# Patient Record
Sex: Male | Born: 1960 | Hispanic: Yes | Marital: Married | Smoking: Never smoker
Health system: Southern US, Community
[De-identification: ages and names within clinical notes are randomized; demographics above are authoritative.]

## PROBLEM LIST (undated history)

## (undated) DIAGNOSIS — E119 Type 2 diabetes mellitus without complications: Secondary | ICD-10-CM

## (undated) DIAGNOSIS — I10 Essential (primary) hypertension: Secondary | ICD-10-CM

## (undated) HISTORY — PX: CHOLECYSTECTOMY: SHX55

---

## 2020-03-24 DIAGNOSIS — Z23 Encounter for immunization: Secondary | ICD-10-CM | POA: Diagnosis not present

## 2020-11-23 DIAGNOSIS — Z6834 Body mass index (BMI) 34.0-34.9, adult: Secondary | ICD-10-CM | POA: Diagnosis not present

## 2020-11-23 DIAGNOSIS — E1165 Type 2 diabetes mellitus with hyperglycemia: Secondary | ICD-10-CM | POA: Diagnosis not present

## 2020-11-23 DIAGNOSIS — Z0001 Encounter for general adult medical examination with abnormal findings: Secondary | ICD-10-CM | POA: Diagnosis not present

## 2020-11-23 DIAGNOSIS — I10 Essential (primary) hypertension: Secondary | ICD-10-CM | POA: Diagnosis not present

## 2020-11-23 DIAGNOSIS — E7800 Pure hypercholesterolemia, unspecified: Secondary | ICD-10-CM | POA: Diagnosis not present

## 2020-11-23 DIAGNOSIS — E669 Obesity, unspecified: Secondary | ICD-10-CM | POA: Diagnosis not present

## 2020-11-23 DIAGNOSIS — Z794 Long term (current) use of insulin: Secondary | ICD-10-CM | POA: Diagnosis not present

## 2020-11-23 DIAGNOSIS — M545 Low back pain, unspecified: Secondary | ICD-10-CM | POA: Diagnosis not present

## 2020-12-12 DIAGNOSIS — Z20822 Contact with and (suspected) exposure to covid-19: Secondary | ICD-10-CM | POA: Diagnosis not present

## 2020-12-12 DIAGNOSIS — Z01812 Encounter for preprocedural laboratory examination: Secondary | ICD-10-CM | POA: Diagnosis not present

## 2020-12-19 DIAGNOSIS — Z20822 Contact with and (suspected) exposure to covid-19: Secondary | ICD-10-CM | POA: Diagnosis not present

## 2021-01-18 DIAGNOSIS — R0982 Postnasal drip: Secondary | ICD-10-CM | POA: Diagnosis not present

## 2021-01-18 DIAGNOSIS — R198 Other specified symptoms and signs involving the digestive system and abdomen: Secondary | ICD-10-CM | POA: Diagnosis not present

## 2021-01-18 DIAGNOSIS — R6889 Other general symptoms and signs: Secondary | ICD-10-CM | POA: Diagnosis not present

## 2021-01-18 DIAGNOSIS — H903 Sensorineural hearing loss, bilateral: Secondary | ICD-10-CM | POA: Diagnosis not present

## 2021-03-12 ENCOUNTER — Encounter: Payer: Self-pay | Admitting: Internal Medicine

## 2021-03-12 ENCOUNTER — Other Ambulatory Visit: Payer: Self-pay

## 2021-03-12 ENCOUNTER — Ambulatory Visit (INDEPENDENT_AMBULATORY_CARE_PROVIDER_SITE_OTHER): Payer: Medicaid Other | Admitting: Internal Medicine

## 2021-03-12 VITALS — BP 134/84 | HR 84 | Temp 98.2°F | Ht 67.21 in | Wt 235.8 lb

## 2021-03-12 DIAGNOSIS — I10 Essential (primary) hypertension: Secondary | ICD-10-CM

## 2021-03-12 DIAGNOSIS — E785 Hyperlipidemia, unspecified: Secondary | ICD-10-CM | POA: Diagnosis not present

## 2021-03-12 DIAGNOSIS — E119 Type 2 diabetes mellitus without complications: Secondary | ICD-10-CM | POA: Diagnosis not present

## 2021-03-12 DIAGNOSIS — Z125 Encounter for screening for malignant neoplasm of prostate: Secondary | ICD-10-CM

## 2021-03-12 DIAGNOSIS — E1169 Type 2 diabetes mellitus with other specified complication: Secondary | ICD-10-CM

## 2021-03-12 DIAGNOSIS — Z1211 Encounter for screening for malignant neoplasm of colon: Secondary | ICD-10-CM

## 2021-03-12 DIAGNOSIS — Z1329 Encounter for screening for other suspected endocrine disorder: Secondary | ICD-10-CM | POA: Diagnosis not present

## 2021-03-12 LAB — BAYER DCA HB A1C WAIVED: HB A1C (BAYER DCA - WAIVED): 7 % — ABNORMAL HIGH (ref <7.0)

## 2021-03-12 MED ORDER — TRESIBA FLEXTOUCH 100 UNIT/ML ~~LOC~~ SOPN: 40.0000 [IU] | PEN_INJECTOR | Freq: Every day | SUBCUTANEOUS | 4 refills | Status: DC

## 2021-03-12 MED ORDER — LIDOCAINE 5 % EX PTCH: 1.0000 | MEDICATED_PATCH | CUTANEOUS | 1 refills | Status: DC

## 2021-03-12 MED ORDER — TRULICITY 1.5 MG/0.5ML ~~LOC~~ SOAJ: 1.5000 mg | SUBCUTANEOUS | 6 refills | Status: DC

## 2021-03-12 NOTE — Progress Notes (Signed)
BP 134/84   Pulse 84   Temp 98.2 F (36.8 C) (Oral)   Ht 5' 7.21" (1.707 m)   Wt 235 lb 12.8 oz (107 kg)   SpO2 95%   BMI 36.71 kg/m    Subjective:    Patient ID: Devin Arias, male    DOB: 1961/01/24, 60 y.o.   MRN: [id number]  HPI: Luiscarlos Kaczmarczyk is a 60 y.o. male  Pt moved from [place] to [place] and was looking for a pcp  Has a ho DM is on trulicity 4.00 once a week, tresiba 46 untis and metformin 1000 mg bid ,  Was seeing a pcp - [facility]   Back Pain Pertinent negatives include no headaches, weakness or weight loss.  Diabetes He presents for his follow-up (fsbs 180 - 160 checked 2 weeks ago) diabetic visit. He has type 2 diabetes mellitus. Pertinent negatives for hypoglycemia include no headaches. Pertinent negatives for diabetes include no blurred vision, no fatigue, no foot ulcerations, no polydipsia, no polyphagia, no visual change, no weakness and no weight loss. (Has diarreha in the evening )  Hypertension This is a chronic (losartan hctz) problem. Episode onset: ? if on plain losartan or losartan / hctz  The problem is controlled. Pertinent negatives include no anxiety, blurred vision, headaches, malaise/fatigue, neck pain, palpitations, peripheral edema, PND or shortness of breath.    Chief Complaint  Patient presents with  . New Patient (Initial Visit)    Patient states he has DM and HTN. Just moved to area.  . Back Pain    Patient has back pain only at night when he lays in bed.    Relevant past medical, surgical, family and social history reviewed and updated as indicated. Interim medical history since our last visit reviewed. Allergies and medications reviewed and updated.  Review of Systems  Constitutional: Negative for fatigue, malaise/fatigue and weight loss.  Eyes: Negative for blurred vision.  Respiratory: Negative for shortness of breath.   Cardiovascular: Negative for palpitations and PND.  Endocrine: Negative for polydipsia and  polyphagia.  Musculoskeletal: Positive for back pain. Negative for neck pain.  Neurological: Negative for weakness and headaches.    Per HPI unless specifically indicated above     Objective:    BP 134/84   Pulse 84   Temp 98.2 F (36.8 C) (Oral)   Ht 5' 7.21" (1.707 m)   Wt 235 lb 12.8 oz (107 kg)   SpO2 95%   BMI 36.71 kg/m   Wt Readings from Last 3 Encounters:  03/12/21 235 lb 12.8 oz (107 kg)    Physical Exam Vitals and nursing note reviewed.  Constitutional:      General: He is not in acute distress.    Appearance: Normal appearance. He is not ill-appearing or diaphoretic.  HENT:     Head: Normocephalic and atraumatic.     Right Ear: Tympanic membrane and external ear normal. There is no impacted cerumen.     Left Ear: External ear normal.     Nose: No congestion or rhinorrhea.     Mouth/Throat:     Pharynx: No oropharyngeal exudate or posterior oropharyngeal erythema.  Eyes:     Conjunctiva/sclera: Conjunctivae normal.     Pupils: Pupils are equal, round, and reactive to light.  Cardiovascular:     Rate and Rhythm: Normal rate and regular rhythm.     Heart sounds: No murmur heard. No friction rub. No gallop.   Pulmonary:     Effort: No respiratory  distress.     Breath sounds: No stridor. No wheezing or rhonchi.  Chest:     Chest wall: No tenderness.  Abdominal:     General: Abdomen is flat. Bowel sounds are normal.     Palpations: Abdomen is soft. There is no mass.     Tenderness: There is no abdominal tenderness.  Musculoskeletal:     Cervical back: Normal range of motion and neck supple. No rigidity or tenderness.     Left lower leg: No edema.  Skin:    General: Skin is warm and dry.  Neurological:     Mental Status: He is alert.     No results found for this or any previous visit.      Current Outpatient Medications:  .  atorvastatin (LIPITOR) 40 MG tablet, Take by mouth., Disp: , Rfl:  .  Cholecalciferol 50 MCG (2000 UT) TABS, Take by  mouth., Disp: , Rfl:  .  Dulaglutide (TRULICITY) 2.35 TI/1.4ER SOPN, Inject 0.75 mg into the skin once a week., Disp: , Rfl:  .  insulin degludec (TRESIBA FLEXTOUCH) 100 UNIT/ML FlexTouch Pen, Inject 46 Units into the skin daily., Disp: , Rfl:  .  losartan (COZAAR) 100 MG tablet, Take by mouth., Disp: , Rfl:  .  metFORMIN (GLUCOPHAGE) 1000 MG tablet, Take by mouth., Disp: , Rfl:     Assessment & Plan:  1. HTN : stable. HTN :  Continue current meds.  Medication compliance emphasised. pt advised to keep Bp logs. Pt verbalised understanding of the same. Pt to have a low salt diet . Exercise to reach a goal of at least 150 mins a week.  lifestyle modifications explained and pt understands importance of the above.    2. DM: check HbA1c,  urine  microalbumin  diabetic diet plan given to pt  adviced regarding hypoglycemia and instructions given to pt today on how to prevent and treat the same if it were to occur. pt acknowledges the plan and voices understanding of the same.  exercise plan given and encouraged.   advice diabetic yearly podiatry, ophthalmology , nutritionist , dental check q 6 months  Problem List Items Addressed This Visit   None   Visit Diagnoses    Diabetes mellitus without complication ([place])    -  Primary   Relevant Medications   atorvastatin (LIPITOR) 40 MG tablet   losartan (COZAAR) 100 MG tablet   metFORMIN (GLUCOPHAGE) 1000 MG tablet   Dulaglutide (TRULICITY) 1.54 MG/8.6PY SOPN   insulin degludec (TRESIBA FLEXTOUCH) 100 UNIT/ML FlexTouch Pen   Other Relevant Orders   Bayer DCA Hb A1c Waived (STAT)       Follow up plan: No follow-ups on file.  Health Maintenance : [facility] : Pneumonia vaccine : need records.

## 2021-03-13 ENCOUNTER — Telehealth: Payer: Self-pay | Admitting: *Deleted

## 2021-03-13 ENCOUNTER — Other Ambulatory Visit: Payer: Medicaid Other

## 2021-03-13 DIAGNOSIS — E119 Type 2 diabetes mellitus without complications: Secondary | ICD-10-CM | POA: Diagnosis not present

## 2021-03-13 DIAGNOSIS — Z125 Encounter for screening for malignant neoplasm of prostate: Secondary | ICD-10-CM | POA: Diagnosis not present

## 2021-03-13 DIAGNOSIS — E1169 Type 2 diabetes mellitus with other specified complication: Secondary | ICD-10-CM | POA: Diagnosis not present

## 2021-03-13 DIAGNOSIS — E785 Hyperlipidemia, unspecified: Secondary | ICD-10-CM | POA: Diagnosis not present

## 2021-03-13 DIAGNOSIS — Z1329 Encounter for screening for other suspected endocrine disorder: Secondary | ICD-10-CM | POA: Diagnosis not present

## 2021-03-13 DIAGNOSIS — I10 Essential (primary) hypertension: Secondary | ICD-10-CM | POA: Diagnosis not present

## 2021-03-13 LAB — MICROALBUMIN, URINE WAIVED
Creatinine, Urine Waived: 300 mg/dL (ref 10–300)
Microalb, Ur Waived: 30 mg/L — ABNORMAL HIGH (ref 0–19)
Microalb/Creat Ratio: <30 mg/g (ref <30)

## 2021-03-13 NOTE — Telephone Encounter (Signed)
Called [facility] to see why the patient was dispensed 48 unit directions and not the 40 unit directions that we sent in. Pharmacy dispensed a prescription from the patient's previous provider. Pharmacy advised that the patient should be able to just dial the pen to 40 units to take.  Called and informed patient about medication. Patient verbalized understanding.

## 2021-03-13 NOTE — Telephone Encounter (Signed)
Pt. Came in and said his insulin was sent in as 48 units and he was taking 46 units but he thought Dr. Michela Pitcher 40 units he want to know what he need to do please advise Thank You

## 2021-03-13 NOTE — Telephone Encounter (Signed)
RX is written for 40 units daily.   Dr. Neomia Dear, is that how many units you would like for the patient to be taking?

## 2021-03-14 LAB — COMPREHENSIVE METABOLIC PANEL
ALT: 32 IU/L (ref 0–44)
AST: 14 IU/L (ref 0–40)
Albumin/Globulin Ratio: 1.3 (ref 1.2–2.2)
Albumin: 4.1 g/dL (ref 3.8–4.9)
Alkaline Phosphatase: 133 IU/L — ABNORMAL HIGH (ref 44–121)
BUN/Creatinine Ratio: 14 (ref 9–20)
BUN: 12 mg/dL (ref 6–24)
Bilirubin Total: 0.3 mg/dL (ref 0.0–1.2)
CO2: 23 mmol/L (ref 20–29)
Calcium: 9.3 mg/dL (ref 8.7–10.2)
Chloride: 100 mmol/L (ref 96–106)
Creatinine, Ser: 0.88 mg/dL (ref 0.76–1.27)
Globulin, Total: 3.1 g/dL (ref 1.5–4.5)
Glucose: 224 mg/dL — ABNORMAL HIGH (ref 65–99)
Potassium: 4 mmol/L (ref 3.5–5.2)
Sodium: 139 mmol/L (ref 134–144)
Total Protein: 7.2 g/dL (ref 6.0–8.5)
eGFR: 99 mL/min/{1.73_m2} (ref >59)

## 2021-03-14 LAB — CBC WITH DIFFERENTIAL/PLATELET
Basophils Absolute: 0 10*3/uL (ref 0.0–0.2)
Basos: 1 %
EOS (ABSOLUTE): 0.3 10*3/uL (ref 0.0–0.4)
Eos: 3 %
Hematocrit: 44.7 % (ref 37.5–51.0)
Hemoglobin: 15.1 g/dL (ref 13.0–17.7)
Immature Grans (Abs): 0.1 10*3/uL (ref 0.0–0.1)
Immature Granulocytes: 1 %
Lymphocytes Absolute: 2.6 10*3/uL (ref 0.7–3.1)
Lymphs: 31 %
MCH: 29.2 pg (ref 26.6–33.0)
MCHC: 33.8 g/dL (ref 31.5–35.7)
MCV: 86 fL (ref 79–97)
Monocytes Absolute: 0.5 10*3/uL (ref 0.1–0.9)
Monocytes: 6 %
Neutrophils Absolute: 5 10*3/uL (ref 1.4–7.0)
Neutrophils: 58 %
Platelets: 240 10*3/uL (ref 150–450)
RBC: 5.18 x10E6/uL (ref 4.14–5.80)
RDW: 13 % (ref 11.6–15.4)
WBC: 8.5 10*3/uL (ref 3.4–10.8)

## 2021-03-14 LAB — LIPID PANEL
Chol/HDL Ratio: 3.5 ratio (ref 0.0–5.0)
Cholesterol, Total: 115 mg/dL (ref 100–199)
HDL: 33 mg/dL — ABNORMAL LOW (ref >39)
LDL Chol Calc (NIH): 43 mg/dL (ref 0–99)
Triglycerides: 249 mg/dL — ABNORMAL HIGH (ref 0–149)
VLDL Cholesterol Cal: 39 mg/dL (ref 5–40)

## 2021-03-14 LAB — PSA TOTAL+% FREE (SERIAL)
PSA, Free Pct: 47.5 %
PSA, Free: 0.19 ng/mL
Prostate Specific Ag, Serum: 0.4 ng/mL (ref 0.0–4.0)

## 2021-03-14 LAB — THYROID PANEL WITH TSH
Free Thyroxine Index: 2 (ref 1.2–4.9)
T3 Uptake Ratio: 26 % (ref 24–39)
T4, Total: 7.6 ug/dL (ref 4.5–12.0)
TSH: 3.57 u[IU]/mL (ref 0.450–4.500)

## 2021-03-15 ENCOUNTER — Ambulatory Visit: Payer: Self-pay | Admitting: Internal Medicine

## 2021-03-29 ENCOUNTER — Other Ambulatory Visit: Payer: Self-pay

## 2021-03-29 ENCOUNTER — Encounter: Payer: Self-pay | Admitting: Internal Medicine

## 2021-03-29 ENCOUNTER — Ambulatory Visit (INDEPENDENT_AMBULATORY_CARE_PROVIDER_SITE_OTHER): Payer: Medicaid Other | Admitting: Internal Medicine

## 2021-03-29 VITALS — BP 144/87 | HR 64 | Temp 97.0°F | Ht 67.21 in | Wt 236.0 lb

## 2021-03-29 DIAGNOSIS — E1169 Type 2 diabetes mellitus with other specified complication: Secondary | ICD-10-CM | POA: Diagnosis not present

## 2021-03-29 DIAGNOSIS — Z125 Encounter for screening for malignant neoplasm of prostate: Secondary | ICD-10-CM | POA: Diagnosis not present

## 2021-03-29 DIAGNOSIS — Z1329 Encounter for screening for other suspected endocrine disorder: Secondary | ICD-10-CM | POA: Diagnosis not present

## 2021-03-29 DIAGNOSIS — E119 Type 2 diabetes mellitus without complications: Secondary | ICD-10-CM

## 2021-03-29 DIAGNOSIS — E785 Hyperlipidemia, unspecified: Secondary | ICD-10-CM | POA: Diagnosis not present

## 2021-03-29 MED ORDER — ATORVASTATIN CALCIUM 80 MG PO TABS: 80.0000 mg | ORAL_TABLET | Freq: Every day | ORAL | 3 refills | Status: DC

## 2021-03-29 MED ORDER — AMLODIPINE BESYLATE 5 MG PO TABS: 5.0000 mg | ORAL_TABLET | Freq: Every day | ORAL | 3 refills | Status: DC

## 2021-03-29 MED ORDER — EMPAGLIFLOZIN 10 MG PO TABS: 10.0000 mg | ORAL_TABLET | Freq: Every day | ORAL | 6 refills | Status: DC

## 2021-03-29 NOTE — Progress Notes (Signed)
BP (!) 144/87   Pulse 64   Temp (!) 97 F (36.1 C) (Oral)   Ht 5' 7.21" (1.707 m)   Wt 236 lb (107 kg)   SpO2 97%   BMI 36.74 kg/m    Subjective:    Patient ID: Devin Arias, male    DOB: 1961/10/14, 60 y.o.   MRN: [id number]  HPI: Devin Arias is a 60 y.o. male  Per pts home bl sugar logs -   Am FSBS - 234 on 4/12 ,Now down to 135 - 190's  2 hours post prandial - 396 4/12 -- now 241    Diabetes He presents for his follow-up diabetic visit. He has type 2 diabetes mellitus. His disease course has been worsening. Pertinent negatives for hypoglycemia include no confusion, dizziness, headaches, hunger, mood changes, nervousness/anxiousness, pallor, seizures, sleepiness, speech difficulty, sweats or tremors. Pertinent negatives for diabetes include no blurred vision, no chest pain, no fatigue, no polydipsia, no polyphagia, no polyuria and no weakness.  Hypertension This is a chronic problem. The current episode started more than 1 year ago. The problem has been gradually improving since onset. The problem is controlled. Pertinent negatives include no anxiety, blurred vision, chest pain, headaches, malaise/fatigue, neck pain, orthopnea, palpitations, peripheral edema, PND, shortness of breath or sweats.    Chief Complaint  Patient presents with  . Diabetes  . Hypertension    Relevant past medical, surgical, family and social history reviewed and updated as indicated. Interim medical history since our last visit reviewed. Allergies and medications reviewed and updated.  Review of Systems  Constitutional: Negative for activity change, appetite change, chills, fatigue, fever and malaise/fatigue.  HENT: Negative for congestion, ear discharge, ear pain and facial swelling.   Eyes: Negative for blurred vision, pain, discharge and itching.  Respiratory: Negative for cough, chest tightness, shortness of breath and wheezing.   Cardiovascular: Negative for chest pain,  palpitations, orthopnea, leg swelling and PND.  Gastrointestinal: Negative for abdominal distention, abdominal pain, blood in stool, constipation, diarrhea, nausea and vomiting.  Endocrine: Negative for cold intolerance, heat intolerance, polydipsia, polyphagia and polyuria.  Genitourinary: Negative for difficulty urinating, dysuria, flank pain, frequency, hematuria and urgency.  Musculoskeletal: Negative for arthralgias, gait problem, joint swelling, myalgias and neck pain.  Skin: Negative for color change, pallor, rash and wound.  Neurological: Negative for dizziness, tremors, seizures, speech difficulty, weakness, light-headedness, numbness and headaches.  Hematological: Does not bruise/bleed easily.  Psychiatric/Behavioral: Negative for agitation, confusion, decreased concentration, sleep disturbance and suicidal ideas. The patient is not nervous/anxious.     Per HPI unless specifically indicated above     Objective:    BP (!) 144/87   Pulse 64   Temp (!) 97 F (36.1 C) (Oral)   Ht 5' 7.21" (1.707 m)   Wt 236 lb (107 kg)   SpO2 97%   BMI 36.74 kg/m   Wt Readings from Last 3 Encounters:  03/29/21 236 lb (107 kg)  03/12/21 235 lb 12.8 oz (107 kg)    Physical Exam Vitals and nursing note reviewed.  Constitutional:      General: He is not in acute distress.    Appearance: Normal appearance. He is not ill-appearing or diaphoretic.  HENT:     Head: Normocephalic and atraumatic.     Right Ear: Tympanic membrane and external ear normal. There is no impacted cerumen.     Left Ear: External ear normal.     Nose: No congestion or rhinorrhea.     Mouth/Throat:  Pharynx: No oropharyngeal exudate or posterior oropharyngeal erythema.  Eyes:     Conjunctiva/sclera: Conjunctivae normal.     Pupils: Pupils are equal, round, and reactive to light.  Cardiovascular:     Rate and Rhythm: Normal rate and regular rhythm.     Heart sounds: No murmur heard. No friction rub. No gallop.    Pulmonary:     Effort: No respiratory distress.     Breath sounds: No stridor. No wheezing or rhonchi.  Chest:     Chest wall: No tenderness.  Abdominal:     General: Abdomen is flat. Bowel sounds are normal. There is no distension.     Palpations: Abdomen is soft. There is no mass.     Tenderness: There is no abdominal tenderness. There is no guarding.  Musculoskeletal:        General: No swelling or deformity.     Cervical back: Normal range of motion and neck supple. No rigidity or tenderness.     Right lower leg: No edema.     Left lower leg: No edema.  Skin:    General: Skin is warm and dry.     Coloration: Skin is not jaundiced.     Findings: No erythema.  Neurological:     Mental Status: He is alert and oriented to person, place, and time. Mental status is at baseline.  Psychiatric:        Mood and Affect: Mood normal.        Behavior: Behavior normal.        Thought Content: Thought content normal.        Judgment: Judgment normal.     Results for orders placed or performed in visit on 03/12/21  Bayer DCA Hb A1c Waived (STAT)  Result Value Ref Range   HB A1C (BAYER DCA - WAIVED) 7.0 (H) <7.0 %  PSA Total+%Free (Serial)  Result Value Ref Range   Prostate Specific Ag, Serum 0.4 0.0 - 4.0 ng/mL   PSA, Free 0.19 N/A ng/mL   PSA, Free Pct 47.5 %  Comprehensive metabolic panel  Result Value Ref Range   Glucose 224 (H) 65 - 99 mg/dL   BUN 12 6 - 24 mg/dL   Creatinine, Ser 0.88 0.76 - 1.27 mg/dL   eGFR 99 >59 mL/min/1.73   BUN/Creatinine Ratio 14 9 - 20   Sodium 139 134 - 144 mmol/L   Potassium 4.0 3.5 - 5.2 mmol/L   Chloride 100 96 - 106 mmol/L   CO2 23 20 - 29 mmol/L   Calcium 9.3 8.7 - 10.2 mg/dL   Total Protein 7.2 6.0 - 8.5 g/dL   Albumin 4.1 3.8 - 4.9 g/dL   Globulin, Total 3.1 1.5 - 4.5 g/dL   Albumin/Globulin Ratio 1.3 1.2 - 2.2   Bilirubin Total 0.3 0.0 - 1.2 mg/dL   Alkaline Phosphatase 133 (H) 44 - 121 IU/L   AST 14 0 - 40 IU/L   ALT 32 0 - 44 IU/L   CBC with Differential/Platelet  Result Value Ref Range   WBC 8.5 3.4 - 10.8 x10E3/uL   RBC 5.18 4.14 - 5.80 x10E6/uL   Hemoglobin 15.1 13.0 - 17.7 g/dL   Hematocrit 44.7 37.5 - 51.0 %   MCV 86 79 - 97 fL   MCH 29.2 26.6 - 33.0 pg   MCHC 33.8 31.5 - 35.7 g/dL   RDW 13.0 11.6 - 15.4 %   Platelets 240 150 - 450 x10E3/uL   Neutrophils 58 Not Estab. %  Lymphs 31 Not Estab. %   Monocytes 6 Not Estab. %   Eos 3 Not Estab. %   Basos 1 Not Estab. %   Neutrophils Absolute 5.0 1.4 - 7.0 x10E3/uL   Lymphocytes Absolute 2.6 0.7 - 3.1 x10E3/uL   Monocytes Absolute 0.5 0.1 - 0.9 x10E3/uL   EOS (ABSOLUTE) 0.3 0.0 - 0.4 x10E3/uL   Basophils Absolute 0.0 0.0 - 0.2 x10E3/uL   Immature Granulocytes 1 Not Estab. %   Immature Grans (Abs) 0.1 0.0 - 0.1 x10E3/uL  Thyroid Panel With TSH  Result Value Ref Range   TSH 3.570 0.450 - 4.500 uIU/mL   T4, Total 7.6 4.5 - 12.0 ug/dL   T3 Uptake Ratio 26 24 - 39 %   Free Thyroxine Index 2.0 1.2 - 4.9  Lipid panel  Result Value Ref Range   Cholesterol, Total 115 100 - 199 mg/dL   Triglycerides 249 (H) 0 - 149 mg/dL   HDL 33 (L) >39 mg/dL   VLDL Cholesterol Cal 39 5 - 40 mg/dL   LDL Chol Calc (NIH) 43 0 - 99 mg/dL   Chol/HDL Ratio 3.5 0.0 - 5.0 ratio  Microalbumin, Urine Waived  Result Value Ref Range   Microalb, Ur Waived 30 (H) 0 - 19 mg/L   Creatinine, Urine Waived 300 10 - 300 mg/dL   Microalb/Creat Ratio <30 <30 mg/g        Current Outpatient Medications:  .  atorvastatin (LIPITOR) 40 MG tablet, Take by mouth., Disp: , Rfl:  .  Cholecalciferol 50 MCG (2000 UT) TABS, Take by mouth., Disp: , Rfl:  .  Dulaglutide (TRULICITY) 1.5 EX/9.3ZJ SOPN, Inject 1.5 mg into the skin once a week., Disp: 0.5 mL, Rfl: 6 .  insulin degludec (TRESIBA FLEXTOUCH) 100 UNIT/ML FlexTouch Pen, Inject 40 Units into the skin at bedtime., Disp: 100 mL, Rfl: 4 .  lidocaine (LIDODERM) 5 %, Place 1 patch onto the skin daily. Remove & Discard patch within 12 hours or as  directed by MD, Disp: 30 patch, Rfl: 1 .  losartan-hydrochlorothiazide (HYZAAR) 100-25 MG tablet, Take 1 tablet by mouth daily., Disp: , Rfl:  .  metFORMIN (GLUCOPHAGE) 1000 MG tablet, Take by mouth., Disp: , Rfl:  .  cyclobenzaprine (FLEXERIL) 10 MG tablet, Take 1 tablet by mouth 3 (three) times daily as needed., Disp: , Rfl:  .  gabapentin (NEURONTIN) 300 MG capsule, Take by mouth at bedtime as needed., Disp: , Rfl:  .  ibuprofen (ADVIL) 600 MG tablet, Take 600 mg by mouth 2 (two) times daily as needed., Disp: , Rfl:     Assessment & Plan:  1.DM :  trulicity increased to 1.5 last visit.  Reduce tresiba to 30 units . Add jardiance 10 mg to regimen as sugars have been better but not at goal.  Ref. Range 03/12/2021 11:16 03/13/2021 08:02 03/13/2021 08:07  Glucose Latest Ref Range: 65 - 99 mg/dL   224 (H)  HB A1C (BAYER DCA - WAIVED) Latest Ref Range: <7.0 % 7.0 (H)      2. HTG  Increase atorvastatin to 80 mg.  recheck FLP, check LFT's work on diet, SE of meds explained to pt. low fat and high fiber diet explained to pt.   Ref. Range 03/13/2021 08:07  Total CHOL/HDL Ratio Latest Ref Range: 0.0 - 5.0 ratio 3.5  Cholesterol, Total Latest Ref Range: 100 - 199 mg/dL 115  HDL Cholesterol Latest Ref Range: >39 mg/dL 33 (L)  Triglycerides Latest Ref Range: 0 - 149  mg/dL 249 (H)  VLDL Cholesterol Cal Latest Ref Range: 5 - 40 mg/dL 39  LDL Chol Calc (NIH) Latest Ref Range: 0 - 99 mg/dL 43    3. HTN stable,add amlodipine bp not at goal. conitnue losartan / hctz Continue current meds.  Medication compliance emphasised. pt advised to keep Bp logs. Pt verbalised understanding of the same. Pt to have a low salt diet . Exercise to reach a goal of at least 150 mins a week.  lifestyle modifications explained and pt understands importance of the above.  Problem List Items Addressed This Visit   None   Visit Diagnoses    Screening PSA (prostate specific antigen)    -  Primary   Relevant Orders   PSA  Total+%Free (Serial)   Diabetes mellitus without complication ([facility])       Relevant Medications   empagliflozin (JARDIANCE) 10 MG TABS tablet   atorvastatin (LIPITOR) 80 MG tablet   Other Relevant Orders   Comprehensive metabolic panel   CBC with Differential/Platelet   Bayer DCA Hb A1c Waived   Screening for thyroid disorder       Relevant Orders   Thyroid Panel With TSH   Hyperlipidemia associated with type 2 diabetes mellitus ([facility])       Relevant Medications   empagliflozin (JARDIANCE) 10 MG TABS tablet   atorvastatin (LIPITOR) 80 MG tablet   Other Relevant Orders   Lipid panel       Follow up plan: Return in about 6 weeks (around 05/10/2021).

## 2021-03-29 NOTE — Patient Instructions (Signed)
Hypertension, Adult Hypertension is another name for high blood pressure. High blood pressure forces your heart to work harder to pump blood. This can cause problems over time. There are two numbers in a blood pressure reading. There is a top number (systolic) over a bottom number (diastolic). It is best to have a blood pressure that is below 120/80. Healthy choices can help lower your blood pressure, or you may need medicine to help lower it. What are the causes? The cause of this condition is not known. Some conditions may be related to high blood pressure. What increases the risk?  Smoking.  Having type 2 diabetes mellitus, high cholesterol, or both.  Not getting enough exercise or physical activity.  Being overweight.  Having too much fat, sugar, calories, or salt (sodium) in your diet.  Drinking too much alcohol.  Having long-term (chronic) kidney disease.  Having a family history of high blood pressure.  Age. Risk increases with age.  Race. You may be at higher risk if you are African American.  Gender. Men are at higher risk than women before age 45. After age 65, women are at higher risk than men.  Having obstructive sleep apnea.  Stress. What are the signs or symptoms?  High blood pressure may not cause symptoms. Very high blood pressure (hypertensive crisis) may cause: ? Headache. ? Feelings of worry or nervousness (anxiety). ? Shortness of breath. ? Nosebleed. ? A feeling of being sick to your stomach (nausea). ? Throwing up (vomiting). ? Changes in how you see. ? Very bad chest pain. ? Seizures. How is this treated?  This condition is treated by making healthy lifestyle changes, such as: ? Eating healthy foods. ? Exercising more. ? Drinking less alcohol.  Your health care provider may prescribe medicine if lifestyle changes are not enough to get your blood pressure under control, and if: ? Your top number is above 130. ? Your bottom number is above  80.  Your personal target blood pressure may vary. Follow these instructions at home: Eating and drinking  If told, follow the DASH eating plan. To follow this plan: ? Fill one half of your plate at each meal with fruits and vegetables. ? Fill one fourth of your plate at each meal with whole grains. Whole grains include whole-wheat pasta, brown rice, and whole-grain bread. ? Eat or drink low-fat dairy products, such as skim milk or low-fat yogurt. ? Fill one fourth of your plate at each meal with low-fat (lean) proteins. Low-fat proteins include fish, chicken without skin, eggs, beans, and tofu. ? Avoid fatty meat, cured and processed meat, or chicken with skin. ? Avoid pre-made or processed food.  Eat less than 1,500 mg of salt each day.  Do not drink alcohol if: ? Your doctor tells you not to drink. ? You are pregnant, may be pregnant, or are planning to become pregnant.  If you drink alcohol: ? Limit how much you use to:  0-1 drink a day for women.  0-2 drinks a day for men. ? Be aware of how much alcohol is in your drink. In the [place] one drink equals one 12 oz bottle of beer (355 mL), one 5 oz glass of wine (148 mL), or one 1 oz glass of hard liquor (44 mL).   Lifestyle  Work with your doctor to stay at a healthy weight or to lose weight. Ask your doctor what the best weight is for you.  Get at least 30 minutes of exercise most   days of the week. This may include walking, swimming, or biking.  Get at least 30 minutes of exercise that strengthens your muscles (resistance exercise) at least 3 days a week. This may include lifting weights or doing Pilates.  Do not use any products that contain nicotine or tobacco, such as cigarettes, e-cigarettes, and chewing tobacco. If you need help quitting, ask your doctor.  Check your blood pressure at home as told by your doctor.  Keep all follow-up visits as told by your doctor. This is important.   Medicines  Take over-the-counter  and prescription medicines only as told by your doctor. Follow directions carefully.  Do not skip doses of blood pressure medicine. The medicine does not work as well if you skip doses. Skipping doses also puts you at risk for problems.  Ask your doctor about side effects or reactions to medicines that you should watch for. Contact a doctor if you:  Think you are having a reaction to the medicine you are taking.  Have headaches that keep coming back (recurring).  Feel dizzy.  Have swelling in your ankles.  Have trouble with your vision. Get help right away if you:  Get a very bad headache.  Start to feel mixed up (confused).  Feel weak or numb.  Feel faint.  Have very bad pain in your: ? Chest. ? Belly (abdomen).  Throw up more than once.  Have trouble breathing. Summary  Hypertension is another name for high blood pressure.  High blood pressure forces your heart to work harder to pump blood.  For most people, a normal blood pressure is less than 120/80.  Making healthy choices can help lower blood pressure. If your blood pressure does not get lower with healthy choices, you may need to take medicine. This information is not intended to replace advice given to you by your health care provider. Make sure you discuss any questions you have with your health care provider. Document Revised: 07/29/2018 Document Reviewed: 07/29/2018 Elsevier Patient Education  2021 [place]. Diabetes Mellitus Action Plan Following a diabetes action plan is a way for you to manage your diabetes (diabetes mellitus) symptoms. The plan is color-coded to help you understand what actions you need to take based on any symptoms you are having.  If you have symptoms in the red zone, you need medical care right away.  If you have symptoms in the yellow zone, you are having problems.  If you have symptoms in the green zone, you are doing well. Learning about and understanding diabetes can  take time. Follow the plan that you develop with your health care provider. Know the target range for your blood sugar (glucose) level, and review your treatment plan with your health care provider at each visit. The target range for my blood sugar level is __________________________ mg/dL. Red zone Get medical help right away if you have any of the following symptoms:  A blood sugar test result that is below 54 mg/dL (3 mmol/L).  A blood sugar test result that is at or above 240 mg/dL (13.3 mmol/L) for 2 days in a row.  Confusion or trouble thinking clearly.  Difficulty breathing.  Sickness or a fever for 2 or more days that is not getting better.  Moderate or large ketone levels in your urine.  Feeling tired or having no energy. If you have any red zone symptoms, do not wait to see if the symptoms will go away. Get medical help right away. Call your local emergency  services (911 in the [place] Do not drive yourself to the hospital. If you have severely low blood sugar (severe hypoglycemia) and you cannot eat or drink, you may need glucagon. Make sure a family member or close friend knows how to check your blood sugar and how to give you glucagon. You may need to be treated in a hospital for this condition.   Yellow zone If you have any of the following symptoms, your diabetes is not under control and you may need to make some changes:  A blood sugar test result that is at or above 240 mg/dL (13.3 mmol/L) for 2 days in a row.  Blood sugar test results that are below 70 mg/dL (3.9 mmol/L).  Other symptoms of hypoglycemia, such as: ? Shaking or feeling light-headed. ? Confusion or irritability. ? Feeling hungry. ? Having a fast heartbeat. If you have any yellow zone symptoms:  Treat your hypoglycemia by eating or drinking 15 grams of a rapid-acting carbohydrate. Follow the 15:15 rule: ? Take 15 grams of a rapid-acting carbohydrate, such as:  1 tube of glucose gel.  4 glucose  pills.  4 oz (120 mL) of fruit juice.  4 oz (120 mL) of regular (not diet) soda. ? Check your blood sugar 15 minutes after you take the carbohydrate. ? If the repeat blood sugar test is still at or below 70 mg/dL (3.9 mmol/L), take 15 grams of a carbohydrate again. ? If your blood sugar does not increase above 70 mg/dL (3.9 mmol/L) after 3 tries, get medical help right away. ? After your blood sugar returns to normal, eat a meal or a snack within 1 hour.  Keep taking your daily medicines as told by your health care provider.  Check your blood sugar more often than you normally would. ? Write down your results. ? Call your health care provider if you have trouble keeping your blood sugar in your target range.   Green zone These signs mean you are doing well and you can continue what you are doing to manage your diabetes:  Your blood sugar is within your personal target range. For most people, a blood sugar level before a meal (preprandial) should be 80-130 mg/dL (4.4-7.2 mmol/L).  You feel well, and you are able to do daily activities. If you are in the green zone, continue to manage your diabetes as told by your health care provider. To do this:  Eat a healthy diet.  Exercise regularly.  Check your blood sugar as told by your health care provider.  Take your medicines as told by your health care provider.   Where to find more information  American Diabetes Association (ADA): diabetes.org  Association of Diabetes Care & Education Specialists (ADCES): diabeteseducator.org Summary  Following a diabetes action plan is a way for you to manage your diabetes symptoms. The plan is color-coded to help you understand what actions you need to take based on any symptoms you are having.  Follow the plan that you develop with your health care provider. Make sure you know your personal target blood sugar level.  Review your treatment plan with your health care provider at each visit. This  information is not intended to replace advice given to you by your health care provider. Make sure you discuss any questions you have with your health care provider. Document Revised: 05/25/2020 Document Reviewed: 05/25/2020 Elsevier Patient Education  [place].

## 2021-05-08 ENCOUNTER — Other Ambulatory Visit: Payer: Medicaid Other

## 2021-05-08 ENCOUNTER — Other Ambulatory Visit: Payer: Self-pay

## 2021-05-08 DIAGNOSIS — E1169 Type 2 diabetes mellitus with other specified complication: Secondary | ICD-10-CM | POA: Diagnosis not present

## 2021-05-08 DIAGNOSIS — Z1329 Encounter for screening for other suspected endocrine disorder: Secondary | ICD-10-CM

## 2021-05-08 DIAGNOSIS — Z125 Encounter for screening for malignant neoplasm of prostate: Secondary | ICD-10-CM | POA: Diagnosis not present

## 2021-05-08 DIAGNOSIS — E785 Hyperlipidemia, unspecified: Secondary | ICD-10-CM | POA: Diagnosis not present

## 2021-05-08 DIAGNOSIS — E119 Type 2 diabetes mellitus without complications: Secondary | ICD-10-CM | POA: Diagnosis not present

## 2021-05-08 LAB — BAYER DCA HB A1C WAIVED: HB A1C (BAYER DCA - WAIVED): 7.2 % — ABNORMAL HIGH (ref <7.0)

## 2021-05-09 LAB — COMPREHENSIVE METABOLIC PANEL
ALT: 34 IU/L (ref 0–44)
AST: 24 IU/L (ref 0–40)
Albumin/Globulin Ratio: 1.4 (ref 1.2–2.2)
Albumin: 4.2 g/dL (ref 3.8–4.9)
Alkaline Phosphatase: 123 IU/L — ABNORMAL HIGH (ref 44–121)
BUN/Creatinine Ratio: 16 (ref 9–20)
BUN: 15 mg/dL (ref 6–24)
Bilirubin Total: 0.5 mg/dL (ref 0.0–1.2)
CO2: 24 mmol/L (ref 20–29)
Calcium: 9.2 mg/dL (ref 8.7–10.2)
Chloride: 100 mmol/L (ref 96–106)
Creatinine, Ser: 0.96 mg/dL (ref 0.76–1.27)
Globulin, Total: 3 g/dL (ref 1.5–4.5)
Glucose: 150 mg/dL — ABNORMAL HIGH (ref 65–99)
Potassium: 3.7 mmol/L (ref 3.5–5.2)
Sodium: 141 mmol/L (ref 134–144)
Total Protein: 7.2 g/dL (ref 6.0–8.5)
eGFR: 91 mL/min/{1.73_m2} (ref >59)

## 2021-05-09 LAB — CBC WITH DIFFERENTIAL/PLATELET
Basophils Absolute: 0 10*3/uL (ref 0.0–0.2)
Basos: 1 %
EOS (ABSOLUTE): 0.3 10*3/uL (ref 0.0–0.4)
Eos: 4 %
Hematocrit: 45.3 % (ref 37.5–51.0)
Hemoglobin: 15.2 g/dL (ref 13.0–17.7)
Immature Grans (Abs): 0 10*3/uL (ref 0.0–0.1)
Immature Granulocytes: 0 %
Lymphocytes Absolute: 2.3 10*3/uL (ref 0.7–3.1)
Lymphs: 29 %
MCH: 29.1 pg (ref 26.6–33.0)
MCHC: 33.6 g/dL (ref 31.5–35.7)
MCV: 87 fL (ref 79–97)
Monocytes Absolute: 0.6 10*3/uL (ref 0.1–0.9)
Monocytes: 7 %
Neutrophils Absolute: 4.6 10*3/uL (ref 1.4–7.0)
Neutrophils: 59 %
Platelets: 233 10*3/uL (ref 150–450)
RBC: 5.23 x10E6/uL (ref 4.14–5.80)
RDW: 13.1 % (ref 11.6–15.4)
WBC: 7.8 10*3/uL (ref 3.4–10.8)

## 2021-05-09 LAB — LIPID PANEL
Chol/HDL Ratio: 3 ratio (ref 0.0–5.0)
Cholesterol, Total: 108 mg/dL (ref 100–199)
HDL: 36 mg/dL — ABNORMAL LOW (ref >39)
LDL Chol Calc (NIH): 43 mg/dL (ref 0–99)
Triglycerides: 177 mg/dL — ABNORMAL HIGH (ref 0–149)
VLDL Cholesterol Cal: 29 mg/dL (ref 5–40)

## 2021-05-09 LAB — THYROID PANEL WITH TSH
Free Thyroxine Index: 2.3 (ref 1.2–4.9)
T3 Uptake Ratio: 28 % (ref 24–39)
T4, Total: 8.1 ug/dL (ref 4.5–12.0)
TSH: 2.39 u[IU]/mL (ref 0.450–4.500)

## 2021-05-09 LAB — PSA TOTAL+% FREE (SERIAL)
PSA, Free Pct: 37.5 %
PSA, Free: 0.15 ng/mL
Prostate Specific Ag, Serum: 0.4 ng/mL (ref 0.0–4.0)

## 2021-05-10 ENCOUNTER — Other Ambulatory Visit: Payer: Self-pay | Admitting: Internal Medicine

## 2021-05-10 NOTE — Telephone Encounter (Signed)
  Notes to clinic: medication filled by a historical provider  Review for refills   Requested Prescriptions  Pending Prescriptions Disp Refills   Cholecalciferol 50 MCG (2000 UT) TABS 30 tablet     Sig: Take by mouth.      Endocrinology:  Vitamins - Vitamin D Supplementation Failed - 05/10/2021  2:08 PM      Failed - 50,000 IU strengths are not delegated      Failed - Phosphate in normal range and within 360 days    No results found for: PHOS        Failed - Vitamin D in normal range and within 360 days    No results found for: [id number], [id number], [id number], Lamar, Raymond, 25OHVITD3, 25OHVITD2, 25OHVITD1, 25OHVITD2, 25OHVITD3, VD25OH        Passed - Ca in normal range and within 360 days    Calcium  Date Value Ref Range Status  05/08/2021 9.2 8.7 - 10.2 mg/dL Final          Passed - Valid encounter within last 12 months    Recent Outpatient Visits           1 month ago Screening PSA (prostate specific antigen)   [facility] Vigg, Avanti, MD   1 month ago Diabetes mellitus without complication ([place])   [place], MD       Future Appointments             In 5 days Vigg, Avanti, MD [facility]

## 2021-05-10 NOTE — Telephone Encounter (Signed)
Pt is requesting a refill of Vit D. Not on his active medication. And pt is not sure who the original prescriber is.  Pharmacy- [place], [phone]

## 2021-05-10 NOTE — Telephone Encounter (Signed)
Scheduled 6/14

## 2021-05-15 ENCOUNTER — Other Ambulatory Visit: Payer: Self-pay

## 2021-05-15 ENCOUNTER — Ambulatory Visit (INDEPENDENT_AMBULATORY_CARE_PROVIDER_SITE_OTHER): Payer: Medicaid Other | Admitting: Internal Medicine

## 2021-05-15 VITALS — BP 125/79 | HR 70 | Temp 98.6°F | Ht 66.97 in | Wt 230.4 lb

## 2021-05-15 DIAGNOSIS — E119 Type 2 diabetes mellitus without complications: Secondary | ICD-10-CM

## 2021-05-15 DIAGNOSIS — E785 Hyperlipidemia, unspecified: Secondary | ICD-10-CM | POA: Diagnosis not present

## 2021-05-15 DIAGNOSIS — E1169 Type 2 diabetes mellitus with other specified complication: Secondary | ICD-10-CM | POA: Diagnosis not present

## 2021-05-15 MED ORDER — TRESIBA FLEXTOUCH 100 UNIT/ML ~~LOC~~ SOPN: 10.0000 [IU] | PEN_INJECTOR | Freq: Every day | SUBCUTANEOUS | 4 refills | Status: DC

## 2021-05-15 MED ORDER — TRULICITY 3 MG/0.5ML ~~LOC~~ SOAJ: 3.0000 mg | SUBCUTANEOUS | 5 refills | Status: DC

## 2021-05-15 MED ORDER — EMPAGLIFLOZIN 25 MG PO TABS: 25.0000 mg | ORAL_TABLET | Freq: Every day | ORAL | 6 refills | Status: DC

## 2021-05-15 NOTE — Progress Notes (Signed)
BP 125/79   Pulse 70   Temp 98.6 F (37 C) (Oral)   Ht 5' 6.97" (1.701 m)   Wt 230 lb 6.4 oz (104.5 kg)   SpO2 99%   BMI 36.12 kg/m    Subjective:    Patient ID: Devin Arias, male    DOB: Apr 12, 1961, 60 y.o.   MRN: [id number]  Chief Complaint  Patient presents with  . Diabetes  . Hyperlipidemia    HPI: Devin Arias is a 60 y.o. male   Here Pt says he feels happy since his sugars have been much better since med changes have been made and he feels better.   Diabetes He presents for his follow-up (feels happy as his FSBS) diabetic visit. He has type 2 diabetes mellitus. His disease course has been improving. Pertinent negatives for diabetes include no chest pain, no foot ulcerations, no polydipsia and no visual change.  Hyperlipidemia Pertinent negatives include no chest pain.   Chief Complaint  Patient presents with  . Diabetes  . Hyperlipidemia    Relevant past medical, surgical, family and social history reviewed and updated as indicated. Interim medical history since our last visit reviewed. Allergies and medications reviewed and updated.  Review of Systems  Cardiovascular:  Negative for chest pain.  Endocrine: Negative for polydipsia.   Per HPI unless specifically indicated above     Objective:    BP 125/79   Pulse 70   Temp 98.6 F (37 C) (Oral)   Ht 5' 6.97" (1.701 m)   Wt 230 lb 6.4 oz (104.5 kg)   SpO2 99%   BMI 36.12 kg/m   Wt Readings from Last 3 Encounters:  05/15/21 230 lb 6.4 oz (104.5 kg)  03/29/21 236 lb (107 kg)  03/12/21 235 lb 12.8 oz (107 kg)    Physical Exam Vitals and nursing note reviewed.  Constitutional:      General: He is not in acute distress.    Appearance: Normal appearance. He is not ill-appearing or diaphoretic.  HENT:     Head: Normocephalic and atraumatic.     Right Ear: Tympanic membrane and external ear normal. There is no impacted cerumen.     Left Ear: External ear normal.     Nose: No congestion  or rhinorrhea.     Mouth/Throat:     Pharynx: No oropharyngeal exudate or posterior oropharyngeal erythema.  Eyes:     Conjunctiva/sclera: Conjunctivae normal.     Pupils: Pupils are equal, round, and reactive to light.  Cardiovascular:     Rate and Rhythm: Normal rate and regular rhythm.     Heart sounds: No murmur heard.   No friction rub. No gallop.  Pulmonary:     Effort: No respiratory distress.     Breath sounds: No stridor. No wheezing or rhonchi.  Chest:     Chest wall: No tenderness.  Abdominal:     General: Abdomen is flat. Bowel sounds are normal.     Palpations: Abdomen is soft. There is no mass.     Tenderness: There is no abdominal tenderness.  Musculoskeletal:     Cervical back: Normal range of motion and neck supple. No rigidity or tenderness.     Left lower leg: No edema.  Skin:    General: Skin is warm and dry.  Neurological:     Mental Status: He is alert.    Results for orders placed or performed in visit on 05/08/21  Lipid panel  Result Value Ref Range  Cholesterol, Total 108 100 - 199 mg/dL   Triglycerides 177 (H) 0 - 149 mg/dL   HDL 36 (L) >39 mg/dL   VLDL Cholesterol Cal 29 5 - 40 mg/dL   LDL Chol Calc (NIH) 43 0 - 99 mg/dL   Chol/HDL Ratio 3.0 0.0 - 5.0 ratio  Bayer DCA Hb A1c Waived  Result Value Ref Range   HB A1C (BAYER DCA - WAIVED) 7.2 (H) <7.0 %  Thyroid Panel With TSH  Result Value Ref Range   TSH 2.390 0.450 - 4.500 uIU/mL   T4, Total 8.1 4.5 - 12.0 ug/dL   T3 Uptake Ratio 28 24 - 39 %   Free Thyroxine Index 2.3 1.2 - 4.9  CBC with Differential/Platelet  Result Value Ref Range   WBC 7.8 3.4 - 10.8 x10E3/uL   RBC 5.23 4.14 - 5.80 x10E6/uL   Hemoglobin 15.2 13.0 - 17.7 g/dL   Hematocrit 45.3 37.5 - 51.0 %   MCV 87 79 - 97 fL   MCH 29.1 26.6 - 33.0 pg   MCHC 33.6 31.5 - 35.7 g/dL   RDW 13.1 11.6 - 15.4 %   Platelets 233 150 - 450 x10E3/uL   Neutrophils 59 Not Estab. %   Lymphs 29 Not Estab. %   Monocytes 7 Not Estab. %   Eos 4  Not Estab. %   Basos 1 Not Estab. %   Neutrophils Absolute 4.6 1.4 - 7.0 x10E3/uL   Lymphocytes Absolute 2.3 0.7 - 3.1 x10E3/uL   Monocytes Absolute 0.6 0.1 - 0.9 x10E3/uL   EOS (ABSOLUTE) 0.3 0.0 - 0.4 x10E3/uL   Basophils Absolute 0.0 0.0 - 0.2 x10E3/uL   Immature Granulocytes 0 Not Estab. %   Immature Grans (Abs) 0.0 0.0 - 0.1 x10E3/uL  Comprehensive metabolic panel  Result Value Ref Range   Glucose 150 (H) 65 - 99 mg/dL   BUN 15 6 - 24 mg/dL   Creatinine, Ser 0.96 0.76 - 1.27 mg/dL   eGFR 91 >59 mL/min/1.73   BUN/Creatinine Ratio 16 9 - 20   Sodium 141 134 - 144 mmol/L   Potassium 3.7 3.5 - 5.2 mmol/L   Chloride 100 96 - 106 mmol/L   CO2 24 20 - 29 mmol/L   Calcium 9.2 8.7 - 10.2 mg/dL   Total Protein 7.2 6.0 - 8.5 g/dL   Albumin 4.2 3.8 - 4.9 g/dL   Globulin, Total 3.0 1.5 - 4.5 g/dL   Albumin/Globulin Ratio 1.4 1.2 - 2.2   Bilirubin Total 0.5 0.0 - 1.2 mg/dL   Alkaline Phosphatase 123 (H) 44 - 121 IU/L   AST 24 0 - 40 IU/L   ALT 34 0 - 44 IU/L  PSA Total+%Free (Serial)  Result Value Ref Range   Prostate Specific Ag, Serum 0.4 0.0 - 4.0 ng/mL   PSA, Free 0.15 N/A ng/mL   PSA, Free Pct 37.5 %        Current Outpatient Medications:  .  amLODipine (NORVASC) 5 MG tablet, Take 1 tablet (5 mg total) by mouth daily., Disp: 90 tablet, Rfl: 3 .  atorvastatin (LIPITOR) 80 MG tablet, Take 1 tablet (80 mg total) by mouth daily., Disp: 90 tablet, Rfl: 3 .  Cholecalciferol 50 MCG (2000 UT) TABS, Take by mouth., Disp: , Rfl:  .  cyclobenzaprine (FLEXERIL) 10 MG tablet, Take 1 tablet by mouth 3 (three) times daily as needed., Disp: , Rfl:  .  Dulaglutide (TRULICITY) 1.5 DG/3.8VF SOPN, Inject 1.5 mg into the skin once a week.,  Disp: 0.5 mL, Rfl: 6 .  empagliflozin (JARDIANCE) 10 MG TABS tablet, Take 1 tablet (10 mg total) by mouth daily before breakfast., Disp: 30 tablet, Rfl: 6 .  gabapentin (NEURONTIN) 300 MG capsule, Take by mouth at bedtime as needed., Disp: , Rfl:  .  ibuprofen  (ADVIL) 600 MG tablet, Take 600 mg by mouth 2 (two) times daily as needed., Disp: , Rfl:  .  insulin degludec (TRESIBA FLEXTOUCH) 100 UNIT/ML FlexTouch Pen, Inject 40 Units into the skin at bedtime., Disp: 100 mL, Rfl: 4 .  lidocaine (LIDODERM) 5 %, Place 1 patch onto the skin daily. Remove & Discard patch within 12 hours or as directed by MD, Disp: 30 patch, Rfl: 1 .  losartan-hydrochlorothiazide (HYZAAR) 100-25 MG tablet, Take 1 tablet by mouth daily., Disp: , Rfl:  .  metFORMIN (GLUCOPHAGE) 1000 MG tablet, Take by mouth., Disp: , Rfl:     Assessment & Plan:  DM Is on metformin 1000 mg bid for such increase jardiacne to 25 mg increase trulicity to 3 JS/2.8 ml and reduce insulin to 10 untis g goal to stop insulin next visit. check HbA1c,  urine  microalbumin  diabetic diet plan given to pt  adviced regarding hypoglycemia and instructions given to pt today on how to prevent and treat the same if it were to occur. pt acknowledges the plan and voices understanding of the same.  exercise plan given and encouraged.   advice diabetic yearly podiatry, ophthalmology , nutritionist , dental check q 6 months,   2. HLD   Ref. Range 03/13/2021 08:07 05/08/2021 08:04 05/08/2021 08:08  Glucose Latest Ref Range: 65 - 99 mg/dL 224 (H)  150 (H)  HB A1C (BAYER DCA - WAIVED) Latest Ref Range: <7.0 %  7.2 (H)    3. HTN :  Is on losartan / hctz 100 /25 and norvasc 5 mg  Continue current meds.  Medication compliance emphasised. pt advised to keep Bp logs. Pt verbalised understanding of the same. Pt to have a low salt diet . Exercise to reach a goal of at least 150 mins a week.  lifestyle modifications explained and pt understands importance of the above.    Problem List Items Addressed This Visit   None    Follow up plan: No follow-ups on file.

## 2021-05-16 ENCOUNTER — Other Ambulatory Visit: Payer: Self-pay

## 2021-05-16 ENCOUNTER — Encounter: Payer: Self-pay | Admitting: Gastroenterology

## 2021-05-16 ENCOUNTER — Ambulatory Visit (INDEPENDENT_AMBULATORY_CARE_PROVIDER_SITE_OTHER): Payer: Medicaid Other | Admitting: Gastroenterology

## 2021-05-16 VITALS — BP 125/79 | HR 89 | Temp 98.1°F | Ht 67.0 in | Wt 231.0 lb

## 2021-05-16 DIAGNOSIS — Z1211 Encounter for screening for malignant neoplasm of colon: Secondary | ICD-10-CM

## 2021-05-16 DIAGNOSIS — R195 Other fecal abnormalities: Secondary | ICD-10-CM

## 2021-05-16 MED ORDER — NA SULFATE-K SULFATE-MG SULF 17.5-3.13-1.6 GM/177ML PO SOLN: ORAL | 0 refills | Status: DC

## 2021-05-16 NOTE — Patient Instructions (Signed)
Dr. Vicente Males would like for you to try taking Metamucil or Citrucel daily to help with your bowels.

## 2021-05-16 NOTE — Progress Notes (Signed)
Jonathon Bellows MD, MRCP(U.K) [address]  [place]  [place], [place] [address]  Main: [phone]  Fax: [phone]   Gastroenterology Consultation  Referring Provider:     Charlynne Cousins, MD Primary Care Physician:  Charlynne Cousins, MD Primary Gastroenterologist:  Dr. Jonathon Bellows  Reason for Consultation:     Bowel problems and colon cancer screening        HPI:   Devin Arias is a 60 y.o. y/o male referred for consultation & management  by Dr. Charlynne Cousins, MD.    He is here today for a screening colonoscopy which he says he has had many years back and is due for 1.  No family history of colon cancer or polyps.  No change in bowel habits that are significant.  No rectal bleeding.  No unintentional weight loss although he is trying to lose weight.  He says that he has often a bowel movement right after he eats within 15 minutes and which is softer than usual going on for the past few months but no significant abdominal pain.  He has been on medications for his diabetes for a few years.  No past medical history on file.  No past surgical history on file.  Prior to Admission medications   Medication Sig Start Date End Date Taking? Authorizing Provider  amLODipine (NORVASC) 5 MG tablet Take 1 tablet (5 mg total) by mouth daily. 03/29/21   Vigg, Avanti, MD  atorvastatin (LIPITOR) 80 MG tablet Take 1 tablet (80 mg total) by mouth daily. 03/29/21   Charlynne Cousins, MD  Cholecalciferol 50 MCG (2000 UT) TABS Take by mouth. 01/25/20   [provider]  cyclobenzaprine (FLEXERIL) 10 MG tablet Take 1 tablet by mouth 3 (three) times daily as needed. 02/17/21   [provider]  Dulaglutide (TRULICITY) 3 UX/3.2GM SOPN Inject 3 mg as directed once a week. 05/15/21   Vigg, Avanti, MD  empagliflozin (JARDIANCE) 25 MG TABS tablet Take 1 tablet (25 mg total) by mouth daily before breakfast. 05/15/21   Vigg, Avanti, MD  gabapentin (NEURONTIN) 300 MG capsule Take by mouth at bedtime as  needed. 11/16/20   [provider]  ibuprofen (ADVIL) 600 MG tablet Take 600 mg by mouth 2 (two) times daily as needed. 02/01/21   [provider]  insulin degludec (TRESIBA FLEXTOUCH) 100 UNIT/ML FlexTouch Pen Inject 10 Units into the skin at bedtime. 05/15/21   Vigg, Avanti, MD  lidocaine (LIDODERM) 5 % Place 1 patch onto the skin daily. Remove & Discard patch within 12 hours or as directed by MD 03/12/21   Charlynne Cousins, MD  losartan-hydrochlorothiazide (HYZAAR) 100-25 MG tablet Take 1 tablet by mouth daily.    [provider]  metFORMIN (GLUCOPHAGE) 1000 MG tablet Take by mouth. 01/25/20   [provider]    No family history on file.   Social History   Tobacco Use   Smoking status: Never   Smokeless tobacco: Never  Substance Use Topics   Alcohol use: Never   Drug use: Never    Allergies as of 05/16/2021   (No Known Allergies)    Review of Systems:    All systems reviewed and negative except where noted in HPI.   Physical Exam:  BP 125/79   Pulse 89   Temp 98.1 F (36.7 C) (Oral)   Ht 5\' 7"  (1.702 m)   Wt 231 lb (104.8 kg)   BMI 36.18 kg/m  No LMP for male patient. Psych:  Alert and cooperative. Normal mood and affect. General:   Alert,  Well-developed, well-nourished, pleasant and cooperative in NAD Head:  Normocephalic and atraumatic. Eyes:  Sclera clear, no icterus.   Conjunctiva pink. Ears:  Normal auditory acuity. Lungs:  Respirations even and unlabored.  Clear throughout to auscultation.   No wheezes, crackles, or rhonchi. No acute distress. Heart:  Regular rate and rhythm; no murmurs, clicks, rubs, or gallops. Abdomen:  Normal bowel sounds.  No bruits.  Soft, non-tender and non-distended without masses, hepatosplenomegaly or hernias noted.  No guarding or rebound tenderness.    Neurologic:  Alert and oriented x3;  grossly normal neurologically. Psych:  Alert and cooperative. Normal mood and affect.  Imaging Studies: No results  found.  Assessment and Plan:   Devin Arias is a 60 y.o. y/o male has been referred for a screening colonoscopy as well as GI issues consisting of a bowel movement right after his meals.  I believe that his bowel movements right after meals could be from a gastrocolic reflex triggered by food intake, his metformin to could be contributing to a degree to a change in the consistency of his stool.  I explained to him the benefits of taking metformin outweigh any risks and assured him to continue taking the same.  I suggested him to increase his dietary fiber in his foods as well as consider taking a supplement to help bulk up the stool and help regularize his bowel movements.  I will set him up for a screening colonoscopy average risk  I have discussed alternative options, risks & benefits,  which include, but are not limited to, bleeding, infection, perforation,respiratory complication & drug reaction.  The patient agrees with this plan & written consent will be obtained.     Follow up in as needed  Dr Jonathon Bellows MD,MRCP(U.[place])

## 2021-05-24 ENCOUNTER — Encounter: Payer: Self-pay | Admitting: Gastroenterology

## 2021-05-25 ENCOUNTER — Encounter: Admission: RE | Payer: Self-pay | Source: Home / Self Care

## 2021-05-25 ENCOUNTER — Ambulatory Visit: Admission: RE | Admit: 2021-05-25 | Payer: Medicaid Other | Source: Home / Self Care | Admitting: Gastroenterology

## 2021-05-25 SURGERY — COLONOSCOPY WITH PROPOFOL
Anesthesia: General

## 2021-06-19 ENCOUNTER — Other Ambulatory Visit: Payer: Self-pay

## 2021-06-19 ENCOUNTER — Other Ambulatory Visit: Payer: Medicaid Other

## 2021-06-19 DIAGNOSIS — E1169 Type 2 diabetes mellitus with other specified complication: Secondary | ICD-10-CM

## 2021-06-19 DIAGNOSIS — E119 Type 2 diabetes mellitus without complications: Secondary | ICD-10-CM | POA: Diagnosis not present

## 2021-06-19 DIAGNOSIS — E785 Hyperlipidemia, unspecified: Secondary | ICD-10-CM | POA: Diagnosis not present

## 2021-06-19 LAB — BAYER DCA HB A1C WAIVED: HB A1C (BAYER DCA - WAIVED): 7.8 % — ABNORMAL HIGH (ref <7.0)

## 2021-06-20 LAB — LIPID PANEL
Chol/HDL Ratio: 2.8 ratio (ref 0.0–5.0)
Cholesterol, Total: 97 mg/dL — ABNORMAL LOW (ref 100–199)
HDL: 35 mg/dL — ABNORMAL LOW (ref >39)
LDL Chol Calc (NIH): 37 mg/dL (ref 0–99)
Triglycerides: 148 mg/dL (ref 0–149)
VLDL Cholesterol Cal: 25 mg/dL (ref 5–40)

## 2021-06-20 LAB — BASIC METABOLIC PANEL
BUN/Creatinine Ratio: 10 (ref 9–20)
BUN: 9 mg/dL (ref 6–24)
CO2: 23 mmol/L (ref 20–29)
Calcium: 9.4 mg/dL (ref 8.7–10.2)
Chloride: 99 mmol/L (ref 96–106)
Creatinine, Ser: 0.93 mg/dL (ref 0.76–1.27)
Glucose: 170 mg/dL — ABNORMAL HIGH (ref 65–99)
Potassium: 4 mmol/L (ref 3.5–5.2)
Sodium: 140 mmol/L (ref 134–144)
eGFR: 95 mL/min/{1.73_m2} (ref >59)

## 2021-06-21 ENCOUNTER — Telehealth: Payer: Medicaid Other

## 2021-06-21 ENCOUNTER — Telehealth (INDEPENDENT_AMBULATORY_CARE_PROVIDER_SITE_OTHER): Payer: Self-pay | Admitting: Gastroenterology

## 2021-06-21 DIAGNOSIS — Z1211 Encounter for screening for malignant neoplasm of colon: Secondary | ICD-10-CM

## 2021-06-21 NOTE — Progress Notes (Signed)
Gastroenterology Pre-Procedure Review  Request Date: 07/05/21 Requesting Physician: Dr. Vicente Males  PATIENT REVIEW QUESTIONS: The patient responded to the following health history questions as indicated:    1. Are you having any GI issues? no 2. Do you have a personal history of Polyps? No 3. Do you have a family history of Colon Cancer or Polyps? no 4. Diabetes Mellitus? yes (Type II) 5. Joint replacements in the past 12 months?no 6. Major health problems in the past 3 months?no 7. Any artificial heart valves, MVP, or defibrillator?no    MEDICATIONS & ALLERGIES:    Patient reports the following regarding taking any anticoagulation/antiplatelet therapy:   Plavix, Coumadin, Eliquis, Xarelto, Lovenox, Pradaxa, Brilinta, or Effient? no Aspirin? no  Patient confirms/reports the following medications:  Current Outpatient Medications  Medication Sig Dispense Refill   amLODipine (NORVASC) 5 MG tablet Take 1 tablet (5 mg total) by mouth daily. 90 tablet 3   atorvastatin (LIPITOR) 80 MG tablet Take 1 tablet (80 mg total) by mouth daily. 90 tablet 3   Cholecalciferol 50 MCG (2000 UT) TABS Take by mouth.     cyclobenzaprine (FLEXERIL) 10 MG tablet Take 1 tablet by mouth 3 (three) times daily as needed.     Dulaglutide (TRULICITY) 3 ES/9.2ZR SOPN Inject 3 mg as directed once a week. 0.5 mL 5   empagliflozin (JARDIANCE) 25 MG TABS tablet Take 1 tablet (25 mg total) by mouth daily before breakfast. 30 tablet 6   gabapentin (NEURONTIN) 300 MG capsule Take by mouth at bedtime as needed.     ibuprofen (ADVIL) 600 MG tablet Take 600 mg by mouth 2 (two) times daily as needed.     insulin degludec (TRESIBA FLEXTOUCH) 100 UNIT/ML FlexTouch Pen Inject 10 Units into the skin at bedtime. 100 mL 4   lidocaine (LIDODERM) 5 % Place 1 patch onto the skin daily. Remove & Discard patch within 12 hours or as directed by MD 30 patch 1   losartan-hydrochlorothiazide (HYZAAR) 100-25 MG tablet Take 1 tablet by mouth daily.      metFORMIN (GLUCOPHAGE) 1000 MG tablet Take by mouth.     Na Sulfate-K Sulfate-Mg Sulf 17.5-3.13-1.6 GM/177ML SOLN At 5 PM the day before your procedure take 1 bottle and 5 hours before procedure take 1 bottle. 354 mL 0   No current facility-administered medications for this visit.    Patient confirms/reports the following allergies:  No Known Allergies  No orders of the defined types were placed in this encounter.   AUTHORIZATION INFORMATION Primary Insurance: 1D#: Group #:  Secondary Insurance: 1D#: Group #:  SCHEDULE INFORMATION: Date: 07/05/21 Time: Location: [place]

## 2021-06-26 ENCOUNTER — Ambulatory Visit (INDEPENDENT_AMBULATORY_CARE_PROVIDER_SITE_OTHER): Payer: Medicaid Other | Admitting: Internal Medicine

## 2021-06-26 ENCOUNTER — Other Ambulatory Visit: Payer: Self-pay

## 2021-06-26 ENCOUNTER — Encounter: Payer: Self-pay | Admitting: Internal Medicine

## 2021-06-26 VITALS — BP 122/80 | HR 80 | Temp 98.6°F | Ht 66.93 in | Wt 223.8 lb

## 2021-06-26 DIAGNOSIS — G4730 Sleep apnea, unspecified: Secondary | ICD-10-CM | POA: Diagnosis not present

## 2021-06-26 DIAGNOSIS — R972 Elevated prostate specific antigen [PSA]: Secondary | ICD-10-CM

## 2021-06-26 DIAGNOSIS — Z1329 Encounter for screening for other suspected endocrine disorder: Secondary | ICD-10-CM

## 2021-06-26 DIAGNOSIS — E1169 Type 2 diabetes mellitus with other specified complication: Secondary | ICD-10-CM

## 2021-06-26 DIAGNOSIS — E785 Hyperlipidemia, unspecified: Secondary | ICD-10-CM

## 2021-06-26 DIAGNOSIS — E119 Type 2 diabetes mellitus without complications: Secondary | ICD-10-CM | POA: Diagnosis not present

## 2021-06-26 MED ORDER — TRULICITY 4.5 MG/0.5ML ~~LOC~~ SOAJ: 4.5000 mg | SUBCUTANEOUS | 6 refills | Status: DC

## 2021-06-26 MED ORDER — EMPAGLIFLOZIN 25 MG PO TABS: 25.0000 mg | ORAL_TABLET | Freq: Every day | ORAL | 6 refills | Status: DC

## 2021-06-26 NOTE — Patient Instructions (Signed)
Results for Devin Arias, Devin Arias (MRN [id number]) as of 06/26/2021 08:34  Ref. Range 06/19/2021 08:03 06/19/2021 59:93  BASIC METABOLIC PANEL Unknown  Rpt (A)  Sodium Latest Ref Range: 134 - 144 mmol/L  140  Potassium Latest Ref Range: 3.5 - 5.2 mmol/L  4.0  Chloride Latest Ref Range: 96 - 106 mmol/L  99  CO2 Latest Ref Range: 20 - 29 mmol/L  23  Glucose Latest Ref Range: 65 - 99 mg/dL  170 (H)  BUN Latest Ref Range: 6 - 24 mg/dL  9  Creatinine Latest Ref Range: 0.76 - 1.27 mg/dL  0.93  Calcium Latest Ref Range: 8.7 - 10.2 mg/dL  9.4  BUN/Creatinine Ratio Latest Ref Range: 9 - 20   10  eGFR Latest Ref Range: >59 mL/min/1.73  95  Total CHOL/HDL Ratio Latest Ref Range: 0.0 - 5.0 ratio  2.8  Cholesterol, Total Latest Ref Range: 100 - 199 mg/dL  97 (L)  HDL Cholesterol Latest Ref Range: >39 mg/dL  35 (L)  Triglycerides Latest Ref Range: 0 - 149 mg/dL  148  VLDL Cholesterol Cal Latest Ref Range: 5 - 40 mg/dL  25  LDL Chol Calc (NIH) Latest Ref Range: 0 - 99 mg/dL  37  HB A1C (BAYER DCA - WAIVED) Latest Ref Range: <7.0 % 7.8 (H)

## 2021-06-26 NOTE — Progress Notes (Signed)
BP 122/80   Pulse 80   Temp 98.6 F (37 C) (Oral)   Ht 5' 6.93" (1.7 m)   Wt 223 lb 12.8 oz (101.5 kg)   SpO2 97%   BMI 35.13 kg/m    Subjective:    Patient ID: Devin Arias, male    DOB: 12/15/1960, 60 y.o.   MRN: [id number]  Chief Complaint  Patient presents with   Diabetes   Hypertension   Hyperlipidemia    HPI: Devin Arias is a 60 y.o. male  Pt is here for a folllow up has a ho DM, sleep apnea for which he was on cpap for x 20 yrs ago co snoring, morning headaches, fatigue   Diabetes He presents for his follow-up diabetic visit. He has type 2 diabetes mellitus. His disease course has been improving. There are no hypoglycemic associated symptoms. Pertinent negatives for hypoglycemia include no confusion, mood changes, nervousness/anxiousness, pallor, sleepiness or speech difficulty. Pertinent negatives for diabetes include no blurred vision, no chest pain, no fatigue, no foot paresthesias, no foot ulcerations, no polydipsia, no polyphagia, no polyuria, no visual change, no weakness and no weight loss.  Hypertension This is a chronic problem. The problem has been gradually improving since onset. Pertinent negatives include no anxiety, blurred vision, chest pain or malaise/fatigue.  Hyperlipidemia This is a chronic problem. Pertinent negatives include no chest pain.   Chief Complaint  Patient presents with   Diabetes   Hypertension   Hyperlipidemia    Relevant past medical, surgical, family and social history reviewed and updated as indicated. Interim medical history since our last visit reviewed. Allergies and medications reviewed and updated.  Review of Systems  Constitutional:  Negative for fatigue, malaise/fatigue and weight loss.  Eyes:  Negative for blurred vision.  Cardiovascular:  Negative for chest pain.  Endocrine: Negative for polydipsia, polyphagia and polyuria.  Skin:  Negative for pallor.  Neurological:  Negative for speech difficulty and  weakness.  Psychiatric/Behavioral:  Negative for confusion. The patient is not nervous/anxious.    Per HPI unless specifically indicated above     Objective:    BP 122/80   Pulse 80   Temp 98.6 F (37 C) (Oral)   Ht 5' 6.93" (1.7 m)   Wt 223 lb 12.8 oz (101.5 kg)   SpO2 97%   BMI 35.13 kg/m   Wt Readings from Last 3 Encounters:  06/26/21 223 lb 12.8 oz (101.5 kg)  05/16/21 231 lb (104.8 kg)  05/15/21 230 lb 6.4 oz (104.5 kg)    Physical Exam Vitals and nursing note reviewed.  Constitutional:      General: He is not in acute distress.    Appearance: Normal appearance. He is not ill-appearing or diaphoretic.  HENT:     Head: Normocephalic and atraumatic.     Right Ear: Tympanic membrane and external ear normal. There is no impacted cerumen.     Left Ear: External ear normal.     Nose: No congestion or rhinorrhea.     Mouth/Throat:     Pharynx: No oropharyngeal exudate or posterior oropharyngeal erythema.  Eyes:     Conjunctiva/sclera: Conjunctivae normal.     Pupils: Pupils are equal, round, and reactive to light.  Cardiovascular:     Rate and Rhythm: Normal rate and regular rhythm.     Heart sounds: No murmur heard.   No friction rub. No gallop.  Pulmonary:     Effort: No respiratory distress.     Breath sounds: No stridor.  No wheezing or rhonchi.  Chest:     Chest wall: No tenderness.  Abdominal:     General: Abdomen is flat. Bowel sounds are normal.     Palpations: Abdomen is soft. There is no mass.     Tenderness: There is no abdominal tenderness.  Musculoskeletal:     Cervical back: Normal range of motion and neck supple. No rigidity or tenderness.     Left lower leg: No edema.  Skin:    General: Skin is warm and dry.  Neurological:     Mental Status: He is alert.    Results for orders placed or performed in visit on 06/19/21  Bayer DCA Hb A1c Waived  Result Value Ref Range   HB A1C (BAYER DCA - WAIVED) 7.8 (H) <8.6 %  Basic metabolic panel  Result  Value Ref Range   Glucose 170 (H) 65 - 99 mg/dL   BUN 9 6 - 24 mg/dL   Creatinine, Ser 0.93 0.76 - 1.27 mg/dL   eGFR 95 >59 mL/min/1.73   BUN/Creatinine Ratio 10 9 - 20   Sodium 140 134 - 144 mmol/L   Potassium 4.0 3.5 - 5.2 mmol/L   Chloride 99 96 - 106 mmol/L   CO2 23 20 - 29 mmol/L   Calcium 9.4 8.7 - 10.2 mg/dL  Lipid panel  Result Value Ref Range   Cholesterol, Total 97 (L) 100 - 199 mg/dL   Triglycerides 148 0 - 149 mg/dL   HDL 35 (L) >39 mg/dL   VLDL Cholesterol Cal 25 5 - 40 mg/dL   LDL Chol Calc (NIH) 37 0 - 99 mg/dL   Chol/HDL Ratio 2.8 0.0 - 5.0 ratio        Current Outpatient Medications:    amLODipine (NORVASC) 5 MG tablet, Take 1 tablet (5 mg total) by mouth daily., Disp: 90 tablet, Rfl: 3   atorvastatin (LIPITOR) 80 MG tablet, Take 1 tablet (80 mg total) by mouth daily., Disp: 90 tablet, Rfl: 3   Cholecalciferol 50 MCG (2000 UT) TABS, Take by mouth., Disp: , Rfl:    cyclobenzaprine (FLEXERIL) 10 MG tablet, Take 1 tablet by mouth 3 (three) times daily as needed., Disp: , Rfl:    Dulaglutide (TRULICITY) 4.5 VE/7.2CN SOPN, Inject 4.5 mg as directed once a week., Disp: 0.5 mL, Rfl: 6   ibuprofen (ADVIL) 600 MG tablet, Take 600 mg by mouth 2 (two) times daily as needed., Disp: , Rfl:    lidocaine (LIDODERM) 5 %, Place 1 patch onto the skin daily. Remove & Discard patch within 12 hours or as directed by MD, Disp: 30 patch, Rfl: 1   losartan-hydrochlorothiazide (HYZAAR) 100-25 MG tablet, Take 1 tablet by mouth daily., Disp: , Rfl:    metFORMIN (GLUCOPHAGE) 1000 MG tablet, Take by mouth., Disp: , Rfl:    empagliflozin (JARDIANCE) 25 MG TABS tablet, Take 1 tablet (25 mg total) by mouth daily before breakfast., Disp: 90 tablet, Rfl: 6   gabapentin (NEURONTIN) 300 MG capsule, Take by mouth at bedtime as needed. (Patient not taking: Reported on 06/26/2021), Disp: , Rfl:    losartan (COZAAR) 100 MG tablet, Take 100 mg by mouth daily., Disp: , Rfl:     Assessment & Plan:  Dm  : A1c 7.8 increase trulicity to 4.5 mg / week and stop tresiba. Jardiance as well check HbA1c,  urine  microalbumin  diabetic diet plan given to pt  adviced regarding hypoglycemia and instructions given to pt today on how to prevent and  treat the same if it were to occur. pt acknowledges the plan and voices understanding of the same.  exercise plan given and encouraged.   advice diabetic yearly podiatry, ophthalmology , nutritionist , dental check q 6 months  2.  Obesity lost about 8 lbs.  Lifestyle modifications advised to pt.   Portion control and avoiding high carb low fat diet advised.  Diet plan given to pt   exercise plan given and encouraged.  To increase exercise to 150 mins a week ie 21/2 hours a week. Pt verbalises understanding of the above.   3. HLD  Is on lipitor 80 mg daily recheck FLP, check LFT's work on diet, SE of meds explained to pt. low fat and high fiber diet explained to pt.  4. Sleep apnea: used to cpap machine x 20 yrs ago. Has morning headache, fatigue Problem List Items Addressed This Visit   None Visit Diagnoses     Sleep apnea, unspecified type    -  Primary   Relevant Orders   Ambulatory referral to Sleep Studies   CMP14+EGFR   PSA Total+%Free (Serial)   CBC with Differential/Platelet   Thyroid Panel With TSH   Bayer DCA Hb A1c Waived   Lipid panel   Diabetes mellitus without complication (HCC)       Relevant Medications   losartan (COZAAR) 100 MG tablet   Dulaglutide (TRULICITY) 4.5 QJ/1.9ER SOPN   empagliflozin (JARDIANCE) 25 MG TABS tablet   Other Relevant Orders   CMP14+EGFR   PSA Total+%Free (Serial)   CBC with Differential/Platelet   Thyroid Panel With TSH   Bayer DCA Hb A1c Waived   Lipid panel   Microalbumin, Urine Waived   Hyperlipidemia associated with type 2 diabetes mellitus (HCC)       Relevant Medications   losartan (COZAAR) 100 MG tablet   Dulaglutide (TRULICITY) 4.5 DE/0.8XK SOPN   empagliflozin (JARDIANCE) 25 MG TABS tablet    Other Relevant Orders   Bayer DCA Hb A1c Waived   Microalbumin, Urine Waived   Screening for thyroid disorder       Relevant Orders   Thyroid Panel With TSH   Elevated PSA       Relevant Orders   PSA Total+%Free (Serial)        Orders Placed This Encounter  Procedures   CMP14+EGFR   PSA Total+%Free (Serial)   CBC with Differential/Platelet   Thyroid Panel With TSH   Bayer DCA Hb A1c Waived   Lipid panel   Microalbumin, Urine Waived   Ambulatory referral to Sleep Studies     Meds ordered this encounter  Medications   Dulaglutide (TRULICITY) 4.5 GY/1.8HU SOPN    Sig: Inject 4.5 mg as directed once a week.    Dispense:  0.5 mL    Refill:  6   empagliflozin (JARDIANCE) 25 MG TABS tablet    Sig: Take 1 tablet (25 mg total) by mouth daily before breakfast.    Dispense:  90 tablet    Refill:  6     Follow up plan: Return in about 3 months (around 09/26/2021).

## 2021-06-27 ENCOUNTER — Telehealth: Payer: Self-pay

## 2021-06-27 NOTE — Telephone Encounter (Signed)
Copied from [place] [phone]. Topic: Referral - Question >> Jun 27, 2021  3:01 PM Loma [place] wrote: Reason for CRM: Pt was given a referral for a sleep study last wk and was called and is in [place]. Pt states that he had requested one in [place] as is too far for him to drive. He states could go to [place] if needed. Pl fu with pt at [phone]

## 2021-07-04 ENCOUNTER — Telehealth: Payer: Self-pay | Admitting: Gastroenterology

## 2021-07-04 ENCOUNTER — Institutional Professional Consult (permissible substitution): Payer: Medicaid Other | Admitting: Neurology

## 2021-07-04 ENCOUNTER — Other Ambulatory Visit: Payer: Self-pay

## 2021-07-04 ENCOUNTER — Telehealth: Payer: Self-pay

## 2021-07-04 DIAGNOSIS — Z1211 Encounter for screening for malignant neoplasm of colon: Secondary | ICD-10-CM

## 2021-07-04 MED ORDER — PEG 3350-KCL-NA BICARB-NACL 420 G PO SOLR: ORAL | 0 refills | Status: DC

## 2021-07-04 NOTE — Telephone Encounter (Signed)
We talk to patient and reschedule patient to 07/19/2021

## 2021-07-04 NOTE — Telephone Encounter (Signed)
Patient cancel colonoscopy due to him eating today. He states he did not know he was not suppose to eat. Would like to reschedule

## 2021-07-04 NOTE — Addendum Note (Signed)
Addended by: Wayna Chalet on: 07/04/2021 04:52 PM   Modules accepted: Orders

## 2021-07-04 NOTE — Telephone Encounter (Signed)
Patient called in a fit of rage, several times to both [place] and I. I tried to ask him questions to find out what was wrong and to no avail, just yelling on the phone. I asked him several times to calm down so I could understand what was wrong again to no avail. I had no choice to hang up on [place] as he would not calm down.

## 2021-07-05 ENCOUNTER — Encounter: Admission: RE | Payer: Self-pay | Source: Home / Self Care

## 2021-07-05 ENCOUNTER — Ambulatory Visit: Admission: RE | Admit: 2021-07-05 | Payer: Medicaid Other | Source: Home / Self Care | Admitting: Gastroenterology

## 2021-07-05 SURGERY — COLONOSCOPY WITH PROPOFOL
Anesthesia: General

## 2021-07-11 ENCOUNTER — Telehealth: Payer: Self-pay

## 2021-07-11 ENCOUNTER — Other Ambulatory Visit: Payer: Self-pay

## 2021-07-11 ENCOUNTER — Ambulatory Visit: Payer: Self-pay | Admitting: *Deleted

## 2021-07-11 DIAGNOSIS — E119 Type 2 diabetes mellitus without complications: Secondary | ICD-10-CM

## 2021-07-11 MED ORDER — METFORMIN HCL 1000 MG PO TABS: 1000.0000 mg | ORAL_TABLET | Freq: Two times a day (BID) | ORAL | 1 refills | Status: DC

## 2021-07-11 NOTE — Telephone Encounter (Signed)
Copied from [name] [id number]. Topic: General - Other >> Jul 11, 2021  3:17 PM Devin Arias A wrote: Reason for CRM: Patient has additional concerns related to their pharmaceutical coordination and would like to discuss with a member of clinical staff when possible   Please contact further when possible

## 2021-07-11 NOTE — Telephone Encounter (Signed)
Reason for Disposition  [1] Caller has URGENT medicine question about med that PCP or specialist prescribed AND [2] triager unable to answer question    He is out of metformin.   However it does not look like he is on metformin.   He is demanding a call back about this by 9:00 this morning.  Answer Assessment - Initial Assessment Questions 1. NAME of MEDICATION: "What medicine are you calling about?"     Metformin I'm out.    I saw the dr during last week of July.   I need the dr to call my pharmacy now and reorder the metformin.   I'm out of it.   I have to be at work by 9:00 so someone has to call now. 2. QUESTION: "What is your question?" (e.g., double dose of medicine, side effect)     He needs clarification on his medications.    Is he supposed to be on the Jardiance?   What about the [place]?   I am supposed to be taking metformin too.   I'm out and need it now this morning before 9:00. I need to know about my medicine.   I need to know now.    3. PRESCRIBING HCP: "Who prescribed it?" Reason: if prescribed by specialist, call should be referred to that group.     Dr. Neomia Dear 4. SYMPTOMS: "Do you have any symptoms?"     No but I need my medicine now 5. SEVERITY: If symptoms are present, ask "Are they mild, moderate or severe?"     N/A 6. PREGNANCY:  "Is there any chance that you are pregnant?" "When was your last menstrual period?"     N/A  Protocols used: Medication Question Call-A-AH

## 2021-07-11 NOTE — Telephone Encounter (Signed)
Routing to provider to clarify. According to note, there is no documentation of the patient being on Metformin, it has not been refilled. Looks like the is on [place] and Trulicity. Is he supposed to be on Metformin?

## 2021-07-11 NOTE — Telephone Encounter (Signed)
Pt called in upset that he is out of metformin this morning.   He is demanding someone call his pharmacy and order the metformin so he can pick it up by 9:00 this morning.     Looking in his chart I don't see where he is still on metformin.   It looks like she increased his Trulicity 4.5 mg a week.   He was to stop the [place].   However he is insisting he is still supposed to be taking the metformin.     He needs clarification of his diabetes medication.   He is demanding this all be done by 9:00 this morning.    I let him know we would try to get this resolved as fast as possible but it may not be by 9:00.    "I must have this done by 9:00"   I asked if we could contact him via his cell phone.   He said yes  [phone].   Leave message.

## 2021-07-11 NOTE — Telephone Encounter (Signed)
Medication explained to patient, refill on metformin sent in by Tammy.

## 2021-07-18 ENCOUNTER — Telehealth: Payer: Self-pay

## 2021-07-18 NOTE — Telephone Encounter (Signed)
Called patient back and went over his colonoscopy and prep instructions in Spanish. I made sure he knew what to do so there is nothing missed in translation. This is the third time that we have to reschedule because he keeps saying that I have not taken the time to explain his instructions. I told him that I would request the endo unit to put him as a first case on 08/02/2021 per patient's request. I then asked if he had further questions and he stated that now he had understood everything. I then called Trish from Endo unit to let her know to switch the patient to 08/02/2021 since he had eaten this morning.

## 2021-07-24 ENCOUNTER — Other Ambulatory Visit: Admission: RE | Admit: 2021-07-24 | Payer: Medicaid Other | Source: Ambulatory Visit

## 2021-07-25 DIAGNOSIS — Z20822 Contact with and (suspected) exposure to covid-19: Secondary | ICD-10-CM | POA: Diagnosis not present

## 2021-07-25 DIAGNOSIS — Z03818 Encounter for observation for suspected exposure to other biological agents ruled out: Secondary | ICD-10-CM | POA: Diagnosis not present

## 2021-07-26 ENCOUNTER — Ambulatory Visit: Payer: Medicaid Other | Attending: Neurology

## 2021-07-26 DIAGNOSIS — G4733 Obstructive sleep apnea (adult) (pediatric): Secondary | ICD-10-CM | POA: Insufficient documentation

## 2021-07-27 ENCOUNTER — Other Ambulatory Visit: Payer: Self-pay

## 2021-08-01 ENCOUNTER — Encounter: Payer: Self-pay | Admitting: Gastroenterology

## 2021-08-02 ENCOUNTER — Ambulatory Visit
Admission: RE | Admit: 2021-08-02 | Discharge: 2021-08-02 | Disposition: A | Discharge status: Home / Self Care | Payer: Medicaid Other | Source: Ambulatory Visit | Attending: Gastroenterology | Admitting: Gastroenterology

## 2021-08-02 ENCOUNTER — Other Ambulatory Visit: Payer: Self-pay

## 2021-08-02 ENCOUNTER — Encounter: Payer: Self-pay | Admitting: Gastroenterology

## 2021-08-02 ENCOUNTER — Ambulatory Visit: Payer: Medicaid Other | Admitting: Registered Nurse

## 2021-08-02 ENCOUNTER — Encounter
Admission: RE | Disposition: A | Discharge status: Home / Self Care | Payer: Self-pay | Source: Ambulatory Visit | Attending: Gastroenterology

## 2021-08-02 DIAGNOSIS — Z79899 Other long term (current) drug therapy: Secondary | ICD-10-CM | POA: Insufficient documentation

## 2021-08-02 DIAGNOSIS — Z7984 Long term (current) use of oral hypoglycemic drugs: Secondary | ICD-10-CM | POA: Diagnosis not present

## 2021-08-02 DIAGNOSIS — E119 Type 2 diabetes mellitus without complications: Secondary | ICD-10-CM | POA: Diagnosis not present

## 2021-08-02 DIAGNOSIS — I10 Essential (primary) hypertension: Secondary | ICD-10-CM | POA: Insufficient documentation

## 2021-08-02 DIAGNOSIS — Z9049 Acquired absence of other specified parts of digestive tract: Secondary | ICD-10-CM | POA: Diagnosis not present

## 2021-08-02 DIAGNOSIS — K635 Polyp of colon: Secondary | ICD-10-CM

## 2021-08-02 DIAGNOSIS — D122 Benign neoplasm of ascending colon: Secondary | ICD-10-CM | POA: Insufficient documentation

## 2021-08-02 DIAGNOSIS — D125 Benign neoplasm of sigmoid colon: Secondary | ICD-10-CM | POA: Insufficient documentation

## 2021-08-02 DIAGNOSIS — D126 Benign neoplasm of colon, unspecified: Secondary | ICD-10-CM | POA: Diagnosis not present

## 2021-08-02 DIAGNOSIS — Z1211 Encounter for screening for malignant neoplasm of colon: Secondary | ICD-10-CM | POA: Insufficient documentation

## 2021-08-02 DIAGNOSIS — K648 Other hemorrhoids: Secondary | ICD-10-CM

## 2021-08-02 HISTORY — DX: Type 2 diabetes mellitus without complications: E11.9

## 2021-08-02 HISTORY — PX: COLONOSCOPY WITH PROPOFOL: SHX5780

## 2021-08-02 HISTORY — DX: Essential (primary) hypertension: I10

## 2021-08-02 LAB — GLUCOSE, CAPILLARY: Glucose-Capillary: 186 mg/dL — ABNORMAL HIGH (ref 70–99)

## 2021-08-02 SURGERY — COLONOSCOPY WITH PROPOFOL
Anesthesia: General

## 2021-08-02 MED ORDER — LIDOCAINE HCL (CARDIAC) PF 100 MG/5ML IV SOSY
PREFILLED_SYRINGE | INTRAVENOUS | Status: DC | PRN
  Administered 2021-08-02: 100 mg via INTRAVENOUS

## 2021-08-02 MED ORDER — METOPROLOL TARTRATE 5 MG/5ML IV SOLN
INTRAVENOUS | Status: AC
  Filled 2021-08-02: qty 5

## 2021-08-02 MED ORDER — PROPOFOL 10 MG/ML IV BOLUS
INTRAVENOUS | Status: DC | PRN
  Administered 2021-08-02: 90 mg via INTRAVENOUS

## 2021-08-02 MED ORDER — SODIUM CHLORIDE 0.9 % IV SOLN
INTRAVENOUS | Status: DC
  Administered 2021-08-02: 1000 mL via INTRAVENOUS

## 2021-08-02 MED ORDER — PROPOFOL 500 MG/50ML IV EMUL
INTRAVENOUS | Status: DC | PRN
  Administered 2021-08-02: 150 ug/kg/min via INTRAVENOUS

## 2021-08-02 NOTE — Progress Notes (Signed)
fe 

## 2021-08-02 NOTE — Op Note (Signed)
[facility] Gastroenterology Patient Name: Devin Arias Procedure Date: 08/02/2021 7:11 AM MRN: [id number] Account #: [id number] Date of Birth: 09-16-1961 Admit Type: Outpatient Age: 60 Room: [facility] ENDO ROOM 3 Gender: Male Note Status: Finalized Procedure:             Colonoscopy Indications:           Screening for colorectal malignant neoplasm Providers:             Jonathon Bellows MD, MD Referring MD:          Charlynne Cousins (Referring MD) Medicines:             Monitored Anesthesia Care Complications:         No immediate complications. Procedure:             Pre-Anesthesia Assessment:                        - Prior to the procedure, a History and Physical was                         performed, and patient medications, allergies and                         sensitivities were reviewed. The patient's tolerance                         of previous anesthesia was reviewed.                        - The risks and benefits of the procedure and the                         sedation options and risks were discussed with the                         patient. All questions were answered and informed                         consent was obtained.                        - ASA Grade Assessment: II - A patient with mild                         systemic disease.                        After obtaining informed consent, the colonoscope was                         passed under direct vision. Throughout the procedure,                         the patient's blood pressure, pulse, and oxygen                         saturations were monitored continuously. The                         Colonoscope was introduced through the anus and  advanced to the the cecum, identified by the                         appendiceal orifice. The colonoscopy was performed                         with ease. The patient tolerated the procedure well.                         The quality of the  bowel preparation was adequate. Findings:      The perianal and digital rectal examinations were normal.      Two sessile polyps were found in the sigmoid colon and ascending colon.       The polyps were 3 to 4 mm in size. These polyps were removed with a       jumbo cold forceps. Resection and retrieval were complete.      A small polyp was found in the distal rectum. The polyp was sessile.       polyp over internal hemorroid- not resectd      The exam was otherwise without abnormality on direct and retroflexion       views. Impression:            - Two 3 to 4 mm polyps in the sigmoid colon and in the                         ascending colon, removed with a jumbo cold forceps.                         Resected and retrieved.                        - One small polyp in the distal rectum.                        - The examination was otherwise normal on direct and                         retroflexion views. Recommendation:        - Discharge patient to home (with escort).                        - Resume previous diet.                        - Continue present medications.                        - Await pathology results.                        - Repeat colonoscopy for surveillance based on                         pathology results.                        - - Refer to Dr Dahlia Byes for resection of polyp over  internal hemorroid Procedure Code(s):     --- Professional ---                        [phone], Colonoscopy, flexible; with biopsy, single or                         multiple Diagnosis Code(s):     --- Professional ---                        Z12.11, Encounter for screening for malignant neoplasm                         of colon                        K63.5, Polyp of colon                        K62.1, Rectal polyp CPT copyright 2019 American Medical Association. All rights reserved. The codes documented in this report are preliminary and upon coder review may  be  revised to meet current compliance requirements. Jonathon Bellows, MD Jonathon Bellows MD, MD 08/02/2021 8:17:33 AM This report has been signed electronically. Number of Addenda: 0 Note Initiated On: 08/02/2021 7:11 AM Scope Withdrawal Time: 0 hours 17 minutes 8 seconds  Total Procedure Duration: 0 hours 24 minutes 3 seconds  Estimated Blood Loss:  Estimated blood loss: none.      [facility]

## 2021-08-02 NOTE — Anesthesia Preprocedure Evaluation (Addendum)
Anesthesia Evaluation  Patient identified by MRN, date of birth, ID band Patient awake    Reviewed: Allergy & Precautions, NPO status , Patient's Chart, lab work & pertinent test results  Airway Mallampati: III  TM Distance: >3 FB Neck ROM: full    Dental  (+) Missing,    Pulmonary neg pulmonary ROS,    Pulmonary exam normal        Cardiovascular Exercise Tolerance: Good hypertension, Normal cardiovascular exam     Neuro/Psych negative neurological ROS  negative psych ROS   GI/Hepatic negative GI ROS, Neg liver ROS,   Endo/Other  diabetes, Type 2, Oral Hypoglycemic Agents  Renal/GU negative Renal ROS  negative genitourinary   Musculoskeletal negative musculoskeletal ROS (+)   Abdominal Normal abdominal exam  (+)   Peds  Hematology negative hematology ROS (+)   Anesthesia Other Findings Past Medical History: No date: Diabetes mellitus without complication ([place])  History reviewed. No pertinent surgical history.     Reproductive/Obstetrics negative OB ROS                            Anesthesia Physical Anesthesia Plan  ASA: 2  Anesthesia Plan: General   Post-op Pain Management:    Induction: Intravenous  PONV Risk Score and Plan: Propofol infusion, TIVA and Treatment may vary due to age or medical condition  Airway Management Planned: Natural Airway and Nasal Cannula  Additional Equipment:   Intra-op Plan:   Post-operative Plan:   Informed Consent: I have reviewed the patients History and Physical, chart, labs and discussed the procedure including the risks, benefits and alternatives for the proposed anesthesia with the patient or authorized representative who has indicated his/her understanding and acceptance.     Dental Advisory Given  Plan Discussed with: Anesthesiologist, CRNA and Surgeon  Anesthesia Plan Comments:         Anesthesia Quick Evaluation

## 2021-08-02 NOTE — Transfer of Care (Signed)
Immediate Anesthesia Transfer of Care Note  Patient: JAMI SEABORN  Procedure(s) Performed: COLONOSCOPY WITH PROPOFOL  Patient Location: Endoscopy Unit  Anesthesia Type:General  Level of Consciousness: drowsy  Airway & Oxygen Therapy: Patient Spontanous Breathing  Post-op Assessment: Report given to RN and Post -op Vital signs reviewed and stable  Post vital signs: Reviewed and stable  Last Vitals:  Vitals Value Taken Time  BP 111/63 08/02/21 0818  Temp    Pulse 84 08/02/21 0818  Resp 14 08/02/21 0818  SpO2 96 % 08/02/21 0818  Vitals shown include unvalidated device data.  Last Pain:  Vitals:   08/02/21 0711  TempSrc: Temporal  PainSc: 0-No pain         Complications: No notable events documented.

## 2021-08-02 NOTE — Anesthesia Postprocedure Evaluation (Signed)
Anesthesia Post Note  Patient: Devin Arias  Procedure(s) Performed: COLONOSCOPY WITH PROPOFOL  Patient location during evaluation: Endoscopy Anesthesia Type: General Level of consciousness: awake and alert Pain management: pain level controlled Vital Signs Assessment: post-procedure vital signs reviewed and stable Respiratory status: spontaneous breathing, nonlabored ventilation and respiratory function stable Cardiovascular status: blood pressure returned to baseline and stable Postop Assessment: no apparent nausea or vomiting Anesthetic complications: no   No notable events documented.   Last Vitals:  Vitals:   08/02/21 0830 08/02/21 0848  BP: 121/83 133/78  Pulse: 85 67  Resp: 16 18  Temp:    SpO2: 96% 95%    Last Pain:  Vitals:   08/02/21 0819  TempSrc: Temporal  PainSc:                  [place] Ouch

## 2021-08-02 NOTE — H&P (Signed)
Devin Bellows, MD [address], [place], [place], [place], [address] [address], [place], [place], [place], [address] Phone: [phone]  Fax: [phone]  Primary Care Physician:  Charlynne Cousins, MD   Pre-Procedure History & Physical: HPI:  Devin Arias is a 60 y.o. male is here for an colonoscopy.   Past Medical History:  Diagnosis Date   Diabetes mellitus without complication ([place])    Hypertension     Past Surgical History:  Procedure Laterality Date   CHOLECYSTECTOMY      Prior to Admission medications   Medication Sig Start Date End Date Taking? Authorizing Provider  amLODipine (NORVASC) 5 MG tablet Take 1 tablet (5 mg total) by mouth daily. 03/29/21  Yes Vigg, Avanti, MD  atorvastatin (LIPITOR) 80 MG tablet Take 1 tablet (80 mg total) by mouth daily. 03/29/21  Yes Vigg, Avanti, MD  Cholecalciferol 50 MCG (2000 UT) TABS Take by mouth. 01/25/20  Yes [provider]  cyclobenzaprine (FLEXERIL) 10 MG tablet Take 1 tablet by mouth 3 (three) times daily as needed. 02/17/21  Yes [provider]  Dulaglutide (TRULICITY) 4.5 0000000 SOPN Inject 4.5 mg as directed once a week. 06/26/21  Yes Vigg, Avanti, MD  empagliflozin (JARDIANCE) 25 MG TABS tablet Take 1 tablet (25 mg total) by mouth daily before breakfast. 06/26/21  Yes Vigg, Avanti, MD  lidocaine (LIDODERM) 5 % Place 1 patch onto the skin daily. Remove & Discard patch within 12 hours or as directed by MD 03/12/21  Yes Vigg, Avanti, MD  losartan (COZAAR) 100 MG tablet Take 100 mg by mouth daily. 05/03/21  Yes [provider]  losartan-hydrochlorothiazide (HYZAAR) 100-25 MG tablet Take 1 tablet by mouth daily.   Yes [provider]  metFORMIN (GLUCOPHAGE) 1000 MG tablet Take 1 tablet (1,000 mg total) by mouth 2 (two) times daily with a meal. 07/11/21  Yes Vigg, Avanti, MD  polyethylene glycol-electrolytes (NULYTELY) 420 g solution Prepare according to package instructions. Starting at  5:00 PM: Drink one 8 oz glass of mixture every 15 minutes until you finish half of the jug. Five hours prior to procedure, drink 8 oz glass of mixture every 15 minutes until it is all gone. Make sure you do not drink anything 4 hours prior to your procedure. 07/04/21  Yes Devin Bellows, MD  gabapentin (NEURONTIN) 300 MG capsule Take by mouth at bedtime as needed. Patient not taking: Reported on 06/26/2021 11/16/20   [provider]  ibuprofen (ADVIL) 600 MG tablet Take 600 mg by mouth 2 (two) times daily as needed. 02/01/21   [provider]    Allergies as of 07/04/2021   (No Known Allergies)    History reviewed. No pertinent family history.  Social History   Socioeconomic History   Marital status: Married    Spouse name: Not on file   Number of children: Not on file   Years of education: Not on file   Highest education level: Not on file  Occupational History   Not on file  Tobacco Use   Smoking status: Never   Smokeless tobacco: Never  Vaping Use   Vaping Use: Never used  Substance and Sexual Activity   Alcohol use: Never   Drug use: Never   Sexual activity: Yes    Partners: Female  Other Topics Concern   Not on file  Social History Narrative   Not on file   Social Determinants of Health   Financial Resource Strain: Not on file  Food Insecurity: Not on file  Transportation Needs: Not on file  Physical Activity: Not on file  Stress: Not on file  Social Connections: Not on file  Intimate Partner Violence: Not on file    Review of Systems: See HPI, otherwise negative ROS  Physical Exam: BP 117/71   Pulse 82   Temp (!) 96.1 F (35.6 C) (Temporal)   Resp 18   Ht '5\' 7"'$  (1.702 m)   Wt 100 kg   SpO2 99%   BMI 34.53 kg/m  General:   Alert,  pleasant and cooperative in NAD Head:  Normocephalic and atraumatic. Neck:  Supple; no masses or thyromegaly. Lungs:  Clear throughout to auscultation, normal respiratory effort.    Heart:  +S1, +S2, Regular  rate and rhythm, No edema. Abdomen:  Soft, nontender and nondistended. Normal bowel sounds, without guarding, and without rebound.   Neurologic:  Alert and  oriented x4;  grossly normal neurologically.  Impression/Plan: Devin Arias is here for an colonoscopy to be performed for Screening colonoscopy average risk   Risks, benefits, limitations, and alternatives regarding  colonoscopy have been reviewed with the patient.  Questions have been answered.  All parties agreeable.   Devin Bellows, MD  08/02/2021, 7:44 AM

## 2021-08-03 ENCOUNTER — Encounter: Payer: Self-pay | Admitting: Gastroenterology

## 2021-08-03 LAB — SURGICAL PATHOLOGY

## 2021-08-09 ENCOUNTER — Telehealth: Payer: Self-pay

## 2021-08-09 DIAGNOSIS — Z86010 Personal history of colonic polyps: Secondary | ICD-10-CM

## 2021-08-09 NOTE — Addendum Note (Signed)
Addended by: Wayna Chalet on: 08/09/2021 09:25 AM   Modules accepted: Orders

## 2021-08-09 NOTE — Telephone Encounter (Signed)
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

## 2021-08-09 NOTE — Telephone Encounter (Signed)
Patient was contacted and informed him that we would refer him to see a general surgeon. Patient agreed.

## 2021-08-13 ENCOUNTER — Ambulatory Visit: Payer: Medicaid Other | Admitting: Surgery

## 2021-08-15 ENCOUNTER — Telehealth: Payer: Self-pay | Admitting: Internal Medicine

## 2021-08-15 NOTE — Telephone Encounter (Signed)
Patient inquiring about sleep study results, patient would like a follow up call today.

## 2021-08-15 NOTE — Telephone Encounter (Signed)
Routing to provider to advise on sleep study. Report scanned into chart on 07/26/21

## 2021-08-17 NOTE — Telephone Encounter (Signed)
Need help with the forms pl thnx.

## 2021-08-18 ENCOUNTER — Other Ambulatory Visit: Payer: Self-pay | Admitting: Internal Medicine

## 2021-08-19 NOTE — Telephone Encounter (Signed)
Pharmacy needs to be called - pt was given 6 refills and got another request. This NT attempted to call pharmacy but it was closed

## 2021-08-20 ENCOUNTER — Other Ambulatory Visit: Payer: Self-pay

## 2021-08-20 ENCOUNTER — Ambulatory Visit: Payer: Medicaid Other | Admitting: Surgery

## 2021-08-20 ENCOUNTER — Telehealth: Payer: Self-pay | Admitting: *Deleted

## 2021-08-20 DIAGNOSIS — G4730 Sleep apnea, unspecified: Secondary | ICD-10-CM

## 2021-08-20 NOTE — Telephone Encounter (Signed)
I called [facility] [phone] to clarify why we got a request for Jardiance 25 mg when Dr. Neomia Dear had written for #90, with 6 refills on 06/26/2021. Pharmacist said it was for 10 mg not 25 mg and it was ready for pick up.   The Rx for 10 mg was sent on 08/18/2021. Will call pt and let him know it's ready for pick up.

## 2021-08-20 NOTE — Telephone Encounter (Signed)
Order for CPAP placed. Will fax to [facility] as soon as signed by provider.   Called and notified patient of what was going on with this.

## 2021-08-20 NOTE — Telephone Encounter (Signed)
Pt is calling back very upset. That he has not heard any response back regarding his sleep study results. Patient states that this is his 3rd time calling with no response. Pt would like someone to call him. [place]- [phone]

## 2021-08-20 NOTE — Telephone Encounter (Signed)
Sending refill request to [facility] for dose clarification.   Is it 25 mg or 10 mg that the pharmacy is saying is ready for the pt to pick up.    Pt has not been notified to pick up Rx yet until clarified.

## 2021-08-20 NOTE — Telephone Encounter (Signed)
Requested medication (s) are due for refill today:   Not sure  Dose needs to be clarified.  Requested medication (s) are on the active medication list:   Yes for 25 mg not 10 mg as pharmacy has ready for pt.  Future visit scheduled:   Yes   Last ordered: 06/26/2021 #90, with 6 refills for 25 mg.     Pharmacy however is saying they got a refill request on 08/18/2021 for 10 mg.   I haven't been able to locate the dose change so that's the reason I'm returning it.    The pharmacy said the 10 mg dose is ready for the pt to pick up however I did not call him since the dose needs to be clarified.    Thanks.   Requested Prescriptions  Pending Prescriptions Disp Refills   JARDIANCE 25 MG TABS tablet [Pharmacy Med Name: JARDIANCE 25 MG TABLET] 90 tablet 2    Sig: TAKE 1 TABLET BY MOUTH DAILY BEFORE BREAKFAST.     Endocrinology:  Diabetes - SGLT2 Inhibitors Passed - 08/18/2021  2:19 PM      Passed - Cr in normal range and within 360 days    Creatinine, Ser  Date Value Ref Range Status  06/19/2021 0.93 0.76 - 1.27 mg/dL Final          Passed - LDL in normal range and within 360 days    LDL Chol Calc (NIH)  Date Value Ref Range Status  06/19/2021 37 0 - 99 mg/dL Final          Passed - HBA1C is between 0 and 7.9 and within 180 days    HB A1C (BAYER DCA - WAIVED)  Date Value Ref Range Status  06/19/2021 7.8 (H) <7.0 % Final    Comment:                                          Diabetic Adult            <7.0                                       Healthy Adult        4.3 - 5.7                                                           (DCCT/NGSP) American Diabetes Association's Summary of Glycemic Recommendations for Adults with Diabetes: Hemoglobin A1c <7.0%. More stringent glycemic goals (A1c <6.0%) may further reduce complications at the cost of increased risk of hypoglycemia.           Passed - eGFR in normal range and within 360 days    eGFR  Date Value Ref Range Status  06/19/2021  95 >59 mL/min/1.73 Final          Passed - Valid encounter within last 6 months    Recent Outpatient Visits           1 month ago Sleep apnea, unspecified type   [facility] Vigg, Avanti, MD   3 months ago Diabetes mellitus without complication ([place])  [facility] Vigg, Avanti, MD   4 months ago Screening PSA (prostate specific antigen)   [facility] Vigg, Avanti, MD   5 months ago Diabetes mellitus without complication ([place])   [place], [place]       Future Appointments             In 1 month Vigg, Avanti, MD [facility]

## 2021-08-22 NOTE — Telephone Encounter (Signed)
RX signed and faxed.

## 2021-08-28 ENCOUNTER — Other Ambulatory Visit: Payer: Self-pay | Admitting: Internal Medicine

## 2021-08-28 MED ORDER — LIDOCAINE 5 % EX PTCH: 1.0000 | MEDICATED_PATCH | CUTANEOUS | 1 refills | Status: DC

## 2021-08-28 NOTE — Telephone Encounter (Signed)
Pt states he just spoke to the sleep apnea place and they told him they have faxed an order for medical necessity and have not gotten it back form the provider yet.  He said they faxed it several times already.  CB#  [phone]

## 2021-08-28 NOTE — Telephone Encounter (Signed)
Medication Refill - Medication: losartan-hctz 100-25 mg and lidocaine patches. Pt would like 90 day supply on each rx  Has the patient contacted their pharmacy? Yes.  Per pt pharm waiting on md   [facility]erred Pharm[facility] (with phone number [facility] [address] in [place] phone number [phone] Has the patient been seen for an appointment in the last year OR does the patient have an upcoming appointment? Yes.    Agent: Please be advised that RX refills may take up to 3 business days. We ask that you follow-up with your pharmacy.

## 2021-08-28 NOTE — Telephone Encounter (Signed)
Requested medications are due for refill today.  yes  Requested medications are on the active medications list.  yes  Last refill. 03/13/2021  Future visit scheduled.   yes  Notes to clinic.  Historical medication.

## 2021-08-28 NOTE — Telephone Encounter (Signed)
Spoke with [employer] [phone] [place] Flow regarding medical necessity form not being received.  Advised that form had been faxed back but still having communication issues.  Will await new form or refax once form is loaded in patients chart.

## 2021-08-29 ENCOUNTER — Ambulatory Visit: Payer: Medicaid Other | Admitting: Surgery

## 2021-08-29 ENCOUNTER — Other Ambulatory Visit: Payer: Self-pay

## 2021-08-29 ENCOUNTER — Encounter: Payer: Self-pay | Admitting: Surgery

## 2021-08-29 VITALS — BP 107/72 | HR 82 | Temp 98.3°F | Ht 67.0 in | Wt 210.6 lb

## 2021-08-29 DIAGNOSIS — K648 Other hemorrhoids: Secondary | ICD-10-CM | POA: Diagnosis not present

## 2021-08-29 DIAGNOSIS — K644 Residual hemorrhoidal skin tags: Secondary | ICD-10-CM | POA: Diagnosis not present

## 2021-08-29 MED ORDER — LOSARTAN POTASSIUM-HCTZ 100-25 MG PO TABS: 1.0000 | ORAL_TABLET | Freq: Every day | ORAL | 2 refills | Status: DC

## 2021-08-29 NOTE — Telephone Encounter (Signed)
Requested medication (s) are due for refill today:   Prescribed yesterday 9/27  Requested medication (s) are on the active medication list:   Yes  Future visit scheduled:   Yes   Last ordered: 08/28/2021 #30 patches, 1 refill  Returned because pharmacy requesting a prior authorization   Requested Prescriptions  Pending Prescriptions Disp Refills   lidocaine (LIDODERM) 5 % [Pharmacy Med Name: LIDOCAINE 5% PATCH] 30 patch 1    Sig: Place 1 patch onto the skin daily. Remove & Discard patch within 12 hours or as directed by MD     Analgesics:  Topicals Passed - 08/28/2021  9:50 PM      Passed - Valid encounter within last 12 months    Recent Outpatient Visits           2 months ago Sleep apnea, unspecified type   [facility] Vigg, Avanti, MD   3 months ago Diabetes mellitus without complication ([place])   [facility] Vigg, Avanti, MD   5 months ago Screening PSA (prostate specific antigen)   [facility] Vigg, Avanti, MD   5 months ago Diabetes mellitus without complication ([place])   [facility] Vigg, Avanti, MD       Future Appointments             In 1 month Vigg, Avanti, MD [facility]

## 2021-08-29 NOTE — Patient Instructions (Signed)
If you have any concerns or questions, please feel free to call our office. Follow up as needed.   Hemorrhoids Hemorrhoids are swollen veins that may develop: In the butt (rectum). These are called internal hemorrhoids. Around the opening of the butt (anus). These are called external hemorrhoids. Hemorrhoids can cause pain, itching, or bleeding. Most of the time, they do not cause serious problems. They usually get better with diet changes, lifestyle changes, and other home treatments. What are the causes? This condition may be caused by: Having trouble pooping (constipation). Pushing hard (straining) to poop. Watery poop (diarrhea). Pregnancy. Being very overweight (obese). Sitting for long periods of time. Heavy lifting or other activity that causes you to strain. Anal sex. Riding a bike for a long period of time. What are the signs or symptoms? Symptoms of this condition include: Pain. Itching or soreness in the butt. Bleeding from the butt. Leaking poop. Swelling in the area. One or more lumps around the opening of your butt. How is this diagnosed? A doctor can often diagnose this condition by looking at the affected area. The doctor may also: Do an exam that involves feeling the area with a gloved hand (digital rectal exam). Examine the area inside your butt using a small tube (anoscope). Order blood tests. This may be done if you have lost a lot of blood. Have you get a test that involves looking inside the colon using a flexible tube with a camera on the end (sigmoidoscopy or colonoscopy). How is this treated? This condition can usually be treated at home. Your doctor may tell you to change what you eat, make lifestyle changes, or try home treatments. If these do not help, procedures can be done to remove the hemorrhoids or make them smaller. These may involve: Placing rubber bands at the base of the hemorrhoids to cut off their blood supply. Injecting medicine into the  hemorrhoids to shrink them. Shining a type of light energy onto the hemorrhoids to cause them to fall off. Doing surgery to remove the hemorrhoids or cut off their blood supply. Follow these instructions at home: Eating and drinking  Eat foods that have a lot of fiber in them. These include whole grains, beans, nuts, fruits, and vegetables. Ask your doctor about taking products that have added fiber (fibersupplements). Reduce the amount of fat in your diet. You can do this by: Eating low-fat dairy products. Eating less red meat. Avoiding processed foods. Drink enough fluid to keep your pee (urine) pale yellow. Managing pain and swelling  Take a warm-water bath (sitz bath) for 20 minutes to ease pain. Do this 3-4 times a day. You may do this in a bathtub or using a portable sitz bath that fits over the toilet. If told, put ice on the painful area. It may be helpful to use ice between your warm baths. Put ice in a plastic bag. Place a towel between your skin and the bag. Leave the ice on for 20 minutes, 2-3 times a day. General instructions Take over-the-counter and prescription medicines only as told by your doctor. Medicated creams and medicines may be used as told. Exercise often. Ask your doctor how much and what kind of exercise is best for you. Go to the bathroom when you have the urge to poop. Do not wait. Avoid pushing too hard when you poop. Keep your butt dry and clean. Use wet toilet paper or moist towelettes after pooping. Do not sit on the toilet for a long time.  Keep all follow-up visits as told by your doctor. This is important. Contact a doctor if you: Have pain and swelling that do not get better with treatment or medicine. Have trouble pooping. Cannot poop. Have pain or swelling outside the area of the hemorrhoids. Get help right away if you have: Bleeding that will not stop. Summary Hemorrhoids are swollen veins in the butt or around the opening of the  butt. They can cause pain, itching, or bleeding. Eat foods that have a lot of fiber in them. These include whole grains, beans, nuts, fruits, and vegetables. Take a warm-water bath (sitz bath) for 20 minutes to ease pain. Do this 3-4 times a day. This information is not intended to replace advice given to you by your health care provider. Make sure you discuss any questions you have with your health care provider. Document Revised: 11/26/2018 Document Reviewed: 04/09/2018 Elsevier Patient Education  [employer].

## 2021-09-04 NOTE — Progress Notes (Signed)
Outpatient Surgical Follow Up  09/04/2021  Devin Arias is an 60 y.o. male.   Chief Complaint  Patient presents with   New Patient (Initial Visit)    hemorrhoids    HPI: Devin Arias is a 60 year old male seen in consultation at the request of Dr. Vicente Males for a perianal lesion.  He denies any anorectal symptoms no pain.  He did have a history of hemorrhoidectomy decades ago and he did not have a good experience.  He is from [place] and his father used to be the doctor for the [employer] engineer and Academic librarian. He has spent most of his life in the [place].  I have personally reviewed the endoscopy he had 3 polyps removed consistent with tubular adenoma.  Dr. Vicente Males found a polyp within the hemorrhoid.  Past Medical History:  Diagnosis Date   Diabetes mellitus without complication ([place])    Hypertension     Past Surgical History:  Procedure Laterality Date   CHOLECYSTECTOMY     COLONOSCOPY WITH PROPOFOL N/A 08/02/2021   Procedure: COLONOSCOPY WITH PROPOFOL;  Surgeon: Jonathon Bellows, MD;  Location[facility] END[facility]COPY;  Service: Gastroenterology;  Laterality: N/A;  PATIENT SAYS HE DOES NOT INTERPRETER    History reviewed. No pertinent family history.  Social History:  reports that he has never smoked. He has never used smokeless tobacco. He reports that he does not drink alcohol and does not use drugs.  Allergies: No Known Allergies  Medications reviewed.    ROS Full ROS performed and is otherwise negative other than what is stated in HPI   BP 107/72   Pulse 82   Temp 98.3 F (36.8 C) (Oral)   Ht 5\' 7"  (1.702 m)   Wt 210 lb 9.6 oz (95.5 kg)   SpO2 95%   BMI 32.98 kg/m   Physical Exam Vitals and nursing note reviewed. Exam conducted with a chaperone present.  Constitutional:      General: He is not in acute distress.    Appearance: Normal appearance.  Cardiovascular:     Rate and Rhythm: Normal rate and regular rhythm.  Abdominal:     General: Abdomen is flat. There is no  distension.     Palpations: Abdomen is soft. There is no mass.     Tenderness: There is no abdominal tenderness. There is no guarding or rebound.     Hernia: No hernia is present.  Genitourinary:    Comments: There is evidence of internal hemorrhoids as well as skin tags.  There is no evidence of a palpable rectal mass.  Low concern for malignancy.  Cannot determine whether there is a polyp within the hemorrhoid or this is just mucosal inflammatory changes. Musculoskeletal:        General: No swelling or tenderness. Normal range of motion.     Cervical back: Normal range of motion and neck supple. No rigidity or tenderness.  Skin:    General: Skin is warm and dry.     Capillary Refill: Capillary refill takes less than 2 seconds.  Neurological:     General: No focal deficit present.     Mental Status: He is alert and oriented to person, place, and time.  Psychiatric:        Mood and Affect: Mood normal.        Behavior: Behavior normal.        Thought Content: Thought content normal.        Judgment: Judgment normal.       Assessment/Plan:  Internal  hemorrhoids and small polyp vs chronic inflammation.  Discussed with the patient in detail.  On my exam they seem benign and I do not have clinical suspicious for being malignant.  Discussed with the patient in detail his options of excision of rectal polyp and hemorrhoidectomy vs observation  .  The patient wishes only to do clinical follow-up without any excision Extensive counseling provided Greater than 50% of the 40 minutes  visit was spent in counseling/coordination of care   Caroleen Hamman, MD [place] Surgeon

## 2021-09-05 ENCOUNTER — Institutional Professional Consult (permissible substitution): Payer: Medicaid Other | Admitting: Neurology

## 2021-09-05 NOTE — Telephone Encounter (Signed)
Patient calling back checking on the status of sleep apnea machine, patient states he would like a call back today stating he really needs his machine.

## 2021-09-07 ENCOUNTER — Telehealth: Payer: Self-pay

## 2021-09-07 NOTE — Telephone Encounter (Signed)
Patient would like refill of vitamin d.

## 2021-09-07 NOTE — Telephone Encounter (Signed)
Spoke with patient he says he is going to meet with [employer] today.  Asked patient is he needed anything else from our office regarding this he said not at this time.

## 2021-09-12 DIAGNOSIS — G4733 Obstructive sleep apnea (adult) (pediatric): Secondary | ICD-10-CM | POA: Diagnosis not present

## 2021-09-12 DIAGNOSIS — I10 Essential (primary) hypertension: Secondary | ICD-10-CM | POA: Diagnosis not present

## 2021-09-14 NOTE — Telephone Encounter (Signed)
Please advise on refill.

## 2021-09-17 NOTE — Telephone Encounter (Signed)
He wants insurance to pay for them

## 2021-09-17 NOTE — Telephone Encounter (Signed)
Its otc pl let pt lknow to pick from any pharmacy thnx.

## 2021-09-21 ENCOUNTER — Other Ambulatory Visit: Payer: Medicaid Other

## 2021-09-28 ENCOUNTER — Ambulatory Visit: Payer: Medicaid Other | Admitting: Internal Medicine

## 2021-10-08 DIAGNOSIS — I10 Essential (primary) hypertension: Secondary | ICD-10-CM | POA: Diagnosis not present

## 2021-10-08 DIAGNOSIS — G4733 Obstructive sleep apnea (adult) (pediatric): Secondary | ICD-10-CM | POA: Diagnosis not present

## 2021-10-12 ENCOUNTER — Other Ambulatory Visit: Payer: Medicaid Other

## 2021-10-12 ENCOUNTER — Other Ambulatory Visit: Payer: Self-pay

## 2021-10-12 DIAGNOSIS — R972 Elevated prostate specific antigen [PSA]: Secondary | ICD-10-CM

## 2021-10-12 DIAGNOSIS — E119 Type 2 diabetes mellitus without complications: Secondary | ICD-10-CM | POA: Diagnosis not present

## 2021-10-12 DIAGNOSIS — G4730 Sleep apnea, unspecified: Secondary | ICD-10-CM | POA: Diagnosis not present

## 2021-10-12 DIAGNOSIS — Z1329 Encounter for screening for other suspected endocrine disorder: Secondary | ICD-10-CM

## 2021-10-12 DIAGNOSIS — E1169 Type 2 diabetes mellitus with other specified complication: Secondary | ICD-10-CM

## 2021-10-15 ENCOUNTER — Encounter: Payer: Self-pay | Admitting: Internal Medicine

## 2021-10-15 ENCOUNTER — Ambulatory Visit (INDEPENDENT_AMBULATORY_CARE_PROVIDER_SITE_OTHER): Payer: Medicaid Other | Admitting: Internal Medicine

## 2021-10-15 ENCOUNTER — Other Ambulatory Visit: Payer: Self-pay

## 2021-10-15 VITALS — BP 125/79 | HR 80 | Temp 98.1°F | Ht 67.01 in | Wt 202.0 lb

## 2021-10-15 DIAGNOSIS — R7989 Other specified abnormal findings of blood chemistry: Secondary | ICD-10-CM | POA: Diagnosis not present

## 2021-10-15 DIAGNOSIS — E119 Type 2 diabetes mellitus without complications: Secondary | ICD-10-CM

## 2021-10-15 LAB — CMP14+EGFR
ALT: 90 IU/L — ABNORMAL HIGH (ref 0–44)
AST: 67 IU/L — ABNORMAL HIGH (ref 0–40)
Albumin/Globulin Ratio: 1.3 (ref 1.2–2.2)
Albumin: 4.3 g/dL (ref 3.8–4.9)
Alkaline Phosphatase: 196 IU/L — ABNORMAL HIGH (ref 44–121)
BUN/Creatinine Ratio: 13 (ref 10–24)
BUN: 10 mg/dL (ref 8–27)
Bilirubin Total: 0.6 mg/dL (ref 0.0–1.2)
CO2: 23 mmol/L (ref 20–29)
Calcium: 9.5 mg/dL (ref 8.6–10.2)
Chloride: 100 mmol/L (ref 96–106)
Creatinine, Ser: 0.8 mg/dL (ref 0.76–1.27)
Globulin, Total: 3.4 g/dL (ref 1.5–4.5)
Glucose: 163 mg/dL — ABNORMAL HIGH (ref 70–99)
Potassium: 3.7 mmol/L (ref 3.5–5.2)
Sodium: 140 mmol/L (ref 134–144)
Total Protein: 7.7 g/dL (ref 6.0–8.5)
eGFR: 101 mL/min/{1.73_m2} (ref >59)

## 2021-10-15 LAB — CBC WITH DIFFERENTIAL/PLATELET
Basophils Absolute: 0 10*3/uL (ref 0.0–0.2)
Basos: 1 %
EOS (ABSOLUTE): 0.3 10*3/uL (ref 0.0–0.4)
Eos: 4 %
Hematocrit: 48.1 % (ref 37.5–51.0)
Hemoglobin: 16.3 g/dL (ref 13.0–17.7)
Immature Grans (Abs): 0 10*3/uL (ref 0.0–0.1)
Immature Granulocytes: 0 %
Lymphocytes Absolute: 2 10*3/uL (ref 0.7–3.1)
Lymphs: 30 %
MCH: 29.3 pg (ref 26.6–33.0)
MCHC: 33.9 g/dL (ref 31.5–35.7)
MCV: 87 fL (ref 79–97)
Monocytes Absolute: 0.5 10*3/uL (ref 0.1–0.9)
Monocytes: 8 %
Neutrophils Absolute: 3.8 10*3/uL (ref 1.4–7.0)
Neutrophils: 57 %
Platelets: 248 10*3/uL (ref 150–450)
RBC: 5.56 x10E6/uL (ref 4.14–5.80)
RDW: 13.3 % (ref 11.6–15.4)
WBC: 6.8 10*3/uL (ref 3.4–10.8)

## 2021-10-15 LAB — HEMOGLOBIN A1C
Est. average glucose Bld gHb Est-mCnc: 217 mg/dL
Hgb A1c MFr Bld: 9.2 % — ABNORMAL HIGH (ref 4.8–5.6)

## 2021-10-15 LAB — LIPID PANEL
Chol/HDL Ratio: 2.5 ratio (ref 0.0–5.0)
Cholesterol, Total: 104 mg/dL (ref 100–199)
HDL: 42 mg/dL (ref >39)
LDL Chol Calc (NIH): 45 mg/dL (ref 0–99)
Triglycerides: 82 mg/dL (ref 0–149)
VLDL Cholesterol Cal: 17 mg/dL (ref 5–40)

## 2021-10-15 LAB — PSA TOTAL+% FREE (SERIAL)
PSA, Free Pct: 46.7 %
PSA, Free: 0.14 ng/mL
Prostate Specific Ag, Serum: 0.3 ng/mL (ref 0.0–4.0)

## 2021-10-15 LAB — THYROID PANEL WITH TSH
Free Thyroxine Index: 2.7 (ref 1.2–4.9)
T3 Uptake Ratio: 28 % (ref 24–39)
T4, Total: 9.5 ug/dL (ref 4.5–12.0)
TSH: 1.8 u[IU]/mL (ref 0.450–4.500)

## 2021-10-15 LAB — BAYER DCA HB A1C WAIVED: HB A1C (BAYER DCA - WAIVED): 8.8 % — ABNORMAL HIGH (ref 4.8–5.6)

## 2021-10-15 MED ORDER — INSULIN DEGLUDEC 100 UNIT/ML ~~LOC~~ SOLN: 15.0000 [IU] | Freq: Every day | SUBCUTANEOUS | 6 refills | Status: DC

## 2021-10-15 MED ORDER — ATORVASTATIN CALCIUM 40 MG PO TABS: 40.0000 mg | ORAL_TABLET | Freq: Every day | ORAL | 2 refills | Status: DC

## 2021-10-15 NOTE — Progress Notes (Signed)
BP 125/79   Pulse 80   Temp 98.1 F (36.7 C) (Oral)   Ht 5' 7.01" (1.702 m)   Wt 214 lb (97.1 kg)   SpO2 98%   BMI 33.51 kg/m    Subjective:    Patient ID: Devin Arias, male    DOB: 1961/01/24, 60 y.o.   MRN: [id number]  Chief Complaint  Patient presents with   Lab work review   Diabetes    HPI: Devin Arias is a 60 y.o. male  Pt is here for a fu he has abnl LFTS   Diabetes He presents for his follow-up diabetic visit. He has type 2 diabetes mellitus. His disease course has been worsening (held insulin last visit, was on tresiba 40 -- down to 10 and thenoff). Pertinent negatives for diabetes include no blurred vision, no chest pain, no fatigue, no foot paresthesias, no foot ulcerations, no polydipsia, no polyphagia, no polyuria, no visual change, no weakness and no weight loss.   Chief Complaint  Patient presents with   Lab work review   Diabetes    Relevant past medical, surgical, family and social history reviewed and updated as indicated. Interim medical history since our last visit reviewed. Allergies and medications reviewed and updated.  Review of Systems  Constitutional:  Negative for fatigue and weight loss.  Eyes:  Negative for blurred vision.  Cardiovascular:  Negative for chest pain.  Endocrine: Negative for polydipsia, polyphagia and polyuria.  Neurological:  Negative for weakness.   Per HPI unless specifically indicated above     Objective:    BP 125/79   Pulse 80   Temp 98.1 F (36.7 C) (Oral)   Ht 5' 7.01" (1.702 m)   Wt 214 lb (97.1 kg)   SpO2 98%   BMI 33.51 kg/m   Wt Readings from Last 3 Encounters:  10/15/21 214 lb (97.1 kg)  08/29/21 210 lb 9.6 oz (95.5 kg)  08/02/21 220 lb 7.4 oz (100 kg)    Physical Exam  Results for orders placed or performed in visit on 10/12/21  Lipid panel  Result Value Ref Range   Cholesterol, Total 104 100 - 199 mg/dL   Triglycerides 82 0 - 149 mg/dL   HDL 42 >39 mg/dL   VLDL  Cholesterol Cal 17 5 - 40 mg/dL   LDL Chol Calc (NIH) 45 0 - 99 mg/dL   Chol/HDL Ratio 2.5 0.0 - 5.0 ratio  Thyroid Panel With TSH  Result Value Ref Range   TSH 1.800 0.450 - 4.500 uIU/mL   T4, Total 9.5 4.5 - 12.0 ug/dL   T3 Uptake Ratio 28 24 - 39 %   Free Thyroxine Index 2.7 1.2 - 4.9  CBC with Differential/Platelet  Result Value Ref Range   WBC 6.8 3.4 - 10.8 x10E3/uL   RBC 5.56 4.14 - 5.80 x10E6/uL   Hemoglobin 16.3 13.0 - 17.7 g/dL   Hematocrit 48.1 37.5 - 51.0 %   MCV 87 79 - 97 fL   MCH 29.3 26.6 - 33.0 pg   MCHC 33.9 31.5 - 35.7 g/dL   RDW 13.3 11.6 - 15.4 %   Platelets 248 150 - 450 x10E3/uL   Neutrophils 57 Not Estab. %   Lymphs 30 Not Estab. %   Monocytes 8 Not Estab. %   Eos 4 Not Estab. %   Basos 1 Not Estab. %   Neutrophils Absolute 3.8 1.4 - 7.0 x10E3/uL   Lymphocytes Absolute 2.0 0.7 - 3.1 x10E3/uL  Monocytes Absolute 0.5 0.1 - 0.9 x10E3/uL   EOS (ABSOLUTE) 0.3 0.0 - 0.4 x10E3/uL   Basophils Absolute 0.0 0.0 - 0.2 x10E3/uL   Immature Granulocytes 0 Not Estab. %   Immature Grans (Abs) 0.0 0.0 - 0.1 x10E3/uL  PSA Total+%Free (Serial)  Result Value Ref Range   Prostate Specific Ag, Serum 0.3 0.0 - 4.0 ng/mL   PSA, Free 0.14 N/A ng/mL   PSA, Free Pct 46.7 %  CMP14+EGFR  Result Value Ref Range   Glucose 163 (H) 70 - 99 mg/dL   BUN 10 8 - 27 mg/dL   Creatinine, Ser 0.80 0.76 - 1.27 mg/dL   eGFR 101 >59 mL/min/1.73   BUN/Creatinine Ratio 13 10 - 24   Sodium 140 134 - 144 mmol/L   Potassium 3.7 3.5 - 5.2 mmol/L   Chloride 100 96 - 106 mmol/L   CO2 23 20 - 29 mmol/L   Calcium 9.5 8.6 - 10.2 mg/dL   Total Protein 7.7 6.0 - 8.5 g/dL   Albumin 4.3 3.8 - 4.9 g/dL   Globulin, Total 3.4 1.5 - 4.5 g/dL   Albumin/Globulin Ratio 1.3 1.2 - 2.2   Bilirubin Total 0.6 0.0 - 1.2 mg/dL   Alkaline Phosphatase 196 (H) 44 - 121 IU/L   AST 67 (H) 0 - 40 IU/L   ALT 90 (H) 0 - 44 IU/L  Hemoglobin A1c  Result Value Ref Range   Hgb A1c MFr Bld 9.2 (H) 4.8 - 5.6 %   Est.  average glucose Bld gHb Est-mCnc 217 mg/dL        Current Outpatient Medications:    amLODipine (NORVASC) 5 MG tablet, Take 1 tablet (5 mg total) by mouth daily., Disp: 90 tablet, Rfl: 3   atorvastatin (LIPITOR) 80 MG tablet, Take 1 tablet (80 mg total) by mouth daily., Disp: 90 tablet, Rfl: 3   cyclobenzaprine (FLEXERIL) 10 MG tablet, Take 1 tablet by mouth 3 (three) times daily as needed., Disp: , Rfl:    Dulaglutide (TRULICITY) 4.5 ZO/1.0RU SOPN, Inject 4.5 mg as directed once a week., Disp: 0.5 mL, Rfl: 6   ibuprofen (ADVIL) 600 MG tablet, Take 600 mg by mouth 2 (two) times daily as needed., Disp: , Rfl:    JARDIANCE 25 MG TABS tablet, TAKE 1 TABLET BY MOUTH DAILY BEFORE BREAKFAST., Disp: 90 tablet, Rfl: 2   lidocaine (LIDODERM) 5 %, PLACE 1 PATCH ONTO THE SKIN DAILY. REMOVE & DISCARD PATCH WITHIN 12 HOURS OR AS DIRECTED BY MD, Disp: 30 patch, Rfl: 1   losartan-hydrochlorothiazide (HYZAAR) 100-25 MG tablet, Take 1 tablet by mouth daily., Disp: 30 tablet, Rfl: 2   metFORMIN (GLUCOPHAGE) 1000 MG tablet, Take 1 tablet (1,000 mg total) by mouth 2 (two) times daily with a meal., Disp: 180 tablet, Rfl: 1   Cholecalciferol 50 MCG (2000 UT) TABS, Take by mouth. (Patient not taking: Reported on 10/15/2021), Disp: , Rfl:     Assessment & Plan:  Elevated LFTs recheck [place] s/p cholecystectomy in calfironia.  Check Hepatitis panel. Pt asymptomatic, no recent travel No blood donation No recent ho infection No D / N / v Check RUQ [place].   DM add treisba 15 units q pm,  Recheck in 6 weeks. Contiue turlicity 4.5 mg  check HbA1c,  urine  microalbumin  diabetic diet plan given to pt  adviced regarding hypoglycemia and instructions given to pt today on how to prevent and treat the same if it were to occur. pt acknowledges the plan and  voices understanding of the same.  exercise plan given and encouraged.   advice diabetic yearly podiatry, ophthalmology , nutritionist , dental check q 6 months  HLD  - cut back on lipitor to 40 mg.   HLD recheck FLP, check LFT's work on diet, SE of meds explained to pt. low fat and high fiber diet explained to pt.   Problem List Items Addressed This Visit   None    Orders Placed This Encounter  Procedures   US Abdomen Limited RUQ (LIVER/GB)   Bayer DCA Hb A1c Waived (STAT)   Hepatitis B core antibody, IgM   Hepatitis B Surface AntiGEN   Hepatitis A antibody, total   Hepatitis C Antibody   Comprehensive metabolic panel     Meds ordered this encounter  Medications   atorvastatin (LIPITOR) 40 MG tablet    Sig: Take 1 tablet (40 mg total) by mouth daily.    Dispense:  30 tablet    Refill:  2   Insulin Degludec (TRESIBA) 100 UNIT/ML SOLN    Sig: Inject 15 Units into the skin daily.    Dispense:  10 mL    Refill:  6     Follow up plan: No follow-ups on file.   Received hepatitis panel hepatitis A antibody positive most likely from a past infection/vaccination D/w pt he has no recollection on any past

## 2021-10-15 NOTE — Progress Notes (Signed)
Can he come on in earlier than thurs pl have abnl labs - elevated Liver function tests and elevated a1c,  Will need to recheck CMP/ Hepatitis panel and d/w pt about these.

## 2021-10-16 LAB — COMPREHENSIVE METABOLIC PANEL
ALT: 88 IU/L — ABNORMAL HIGH (ref 0–44)
AST: 62 IU/L — ABNORMAL HIGH (ref 0–40)
Albumin/Globulin Ratio: 1.6 (ref 1.2–2.2)
Albumin: 4.5 g/dL (ref 3.8–4.9)
Alkaline Phosphatase: 184 IU/L — ABNORMAL HIGH (ref 44–121)
BUN/Creatinine Ratio: 12 (ref 10–24)
BUN: 11 mg/dL (ref 8–27)
Bilirubin Total: 0.5 mg/dL (ref 0.0–1.2)
CO2: 23 mmol/L (ref 20–29)
Calcium: 9.7 mg/dL (ref 8.6–10.2)
Chloride: 101 mmol/L (ref 96–106)
Creatinine, Ser: 0.89 mg/dL (ref 0.76–1.27)
Globulin, Total: 2.9 g/dL (ref 1.5–4.5)
Glucose: 176 mg/dL — ABNORMAL HIGH (ref 70–99)
Potassium: 3.8 mmol/L (ref 3.5–5.2)
Sodium: 141 mmol/L (ref 134–144)
Total Protein: 7.4 g/dL (ref 6.0–8.5)
eGFR: 98 mL/min/{1.73_m2} (ref >59)

## 2021-10-16 LAB — HEPATITIS B CORE ANTIBODY, IGM: Hep B C IgM: NEGATIVE

## 2021-10-16 LAB — HEPATITIS A ANTIBODY, TOTAL: hep A Total Ab: POSITIVE — AB

## 2021-10-16 LAB — HEPATITIS B SURFACE ANTIGEN: Hepatitis B Surface Ag: NEGATIVE

## 2021-10-16 LAB — HEPATITIS C ANTIBODY: Hep C Virus Ab: <0.1 s/co ratio (ref 0.0–0.9)

## 2021-10-18 ENCOUNTER — Ambulatory Visit: Payer: Medicaid Other | Admitting: Internal Medicine

## 2021-10-23 ENCOUNTER — Telehealth: Payer: Self-pay | Admitting: Internal Medicine

## 2021-10-23 MED ORDER — INSULIN PEN NEEDLE 30G X 8 MM MISC: 1.0000 | 2 refills | Status: DC | PRN

## 2021-10-23 MED ORDER — LANTUS SOLOSTAR 100 UNIT/ML ~~LOC~~ SOPN: 15.0000 [IU] | PEN_INJECTOR | Freq: Every morning | SUBCUTANEOUS | 11 refills | Status: DC

## 2021-10-23 NOTE — Telephone Encounter (Signed)
Pt called in about medication Insulin Degludec (TRESIBA) 100 UNIT/ML SOLN  and stated his insurance will not cover and pt is requesting if PCP could send over a different insulin could [facility].

## 2021-10-30 ENCOUNTER — Ambulatory Visit: Payer: Medicaid Other | Attending: Internal Medicine

## 2021-11-07 DIAGNOSIS — G4733 Obstructive sleep apnea (adult) (pediatric): Secondary | ICD-10-CM | POA: Diagnosis not present

## 2021-11-07 DIAGNOSIS — I10 Essential (primary) hypertension: Secondary | ICD-10-CM | POA: Diagnosis not present

## 2021-11-12 ENCOUNTER — Other Ambulatory Visit: Payer: Self-pay | Admitting: Internal Medicine

## 2021-11-12 ENCOUNTER — Ambulatory Visit: Payer: Medicaid Other | Attending: Internal Medicine

## 2021-11-12 NOTE — Telephone Encounter (Signed)
Copied from [place] [id number]. Topic: Quick Communication - Rx Refill/Question >> Nov 12, 2021  2:24 PM Tessa Lerner A wrote: Medication: Dulaglutide (TRULICITY) 4.5 HA/5.7XU SOPN [[id number]] - the patient has no remaining medications   losartan-hydrochlorothiazide (HYZAAR) 100-25 MG tablet [[id number]]    Has the patient contacted their pharmacy? Yes.  The patient has been directed to contact their PCP (Agent: If no, request that the patient contact the pharmacy for the refill. If patient does not wish to contact the pharmacy document the reason why and proceed with request.) (Agent: If yes, when and what did the pharmacy advise?)  Preferred Pharmacy (with phone number or street name): [id number] [address], [place] - [address] [place] [address] Phone: [phone] Fax: [phone]   Has the patient been seen for an appointment in the last year OR does the patient have an upcoming appointment? Yes.    Agent: Please be advised that RX refills may take up to 3 business days. We ask that you follow-up with your pharmacy.

## 2021-11-13 MED ORDER — LOSARTAN POTASSIUM-HCTZ 100-25 MG PO TABS
1.0000 | ORAL_TABLET | Freq: Every day | ORAL | 2 refills | Status: DC
Start: 2021-11-13 — End: 2021-11-28

## 2021-11-13 MED ORDER — TRULICITY 4.5 MG/0.5ML ~~LOC~~ SOAJ: 4.5000 mg | SUBCUTANEOUS | 6 refills | Status: DC

## 2021-11-13 NOTE — Telephone Encounter (Signed)
Requested Prescriptions  Pending Prescriptions Disp Refills  . Dulaglutide (TRULICITY) 4.5 PN/3.6RW SOPN 0.5 mL 6    Sig: Inject 4.5 mg as directed once a week.     Endocrinology:  Diabetes - GLP-1 Receptor Agonists Failed - 11/12/2021  2:35 PM      Failed - HBA1C is between 0 and 7.9 and within 180 days    HB A1C (BAYER DCA - WAIVED)  Date Value Ref Range Status  10/15/2021 8.8 (H) 4.8 - 5.6 % Final    Comment:             Prediabetes: 5.7 - 6.4          Diabetes: >6.4          Glycemic control for adults with diabetes: <7.0          Passed - Valid encounter within last 6 months    Recent Outpatient Visits          4 weeks ago Type 2 diabetes mellitus without complication, without long-term current use of insulin ([place])   [facility] Vigg, Avanti, MD   4 months ago Sleep apnea, unspecified type   [facility] Vigg, Avanti, MD   6 months ago Diabetes mellitus without complication ([place])   [facility] Vigg, Avanti, MD   7 months ago Screening PSA (prostate specific antigen)   [place] Vigg, Avanti, MD   8 months ago Diabetes mellitus without complication ([place])   [facility] Vigg, Avanti, MD             . losartan-hydrochlorothiazide (HYZAAR) 100-25 MG tablet 30 tablet 2    Sig: Take 1 tablet by mouth daily.     Cardiovascular: ARB + Diuretic Combos Passed - 11/12/2021  2:35 PM      Passed - K in normal range and within 180 days    Potassium  Date Value Ref Range Status  10/15/2021 3.8 3.5 - 5.2 mmol/L Final         Passed - Na in normal range and within 180 days    Sodium  Date Value Ref Range Status  10/15/2021 141 134 - 144 mmol/L Final         Passed - Cr in normal range and within 180 days    Creatinine, Ser  Date Value Ref Range Status  10/15/2021 0.89 0.76 - 1.27 mg/dL Final         Passed - Ca in normal range and within 180 days    Calcium  Date Value Ref Range Status  10/15/2021 9.7  8.6 - 10.2 mg/dL Final         Passed - Patient is not pregnant      Passed - Last BP in normal range    BP Readings from Last 1 Encounters:  10/15/21 125/79         Passed - Valid encounter within last 6 months    Recent Outpatient Visits          4 weeks ago Type 2 diabetes mellitus without complication, without long-term current use of insulin ([place])   [facility] Vigg, Avanti, MD   4 months ago Sleep apnea, unspecified type   [facility] Vigg, Avanti, MD   6 months ago Diabetes mellitus without complication ([place])   [facility] Vigg, Avanti, MD   7 months ago Screening PSA (prostate specific antigen)   [place] Vigg, Avanti, MD   8 months  ago Diabetes mellitus without complication [facility])   [facility] Vigg, Avanti, MD

## 2021-11-27 ENCOUNTER — Other Ambulatory Visit: Payer: Self-pay | Admitting: Internal Medicine

## 2021-11-28 NOTE — Telephone Encounter (Signed)
Requested Prescriptions  Pending Prescriptions Disp Refills   losartan-hydrochlorothiazide (HYZAAR) 100-25 MG tablet [Pharmacy Med Name: LOSARTAN-HCTZ 100-25 MG TAB] 90 tablet 0    Sig: TAKE 1 TABLET BY MOUTH EVERY DAY     Cardiovascular: ARB + Diuretic Combos Passed - 11/27/2021  1:24 AM      Passed - K in normal range and within 180 days    Potassium  Date Value Ref Range Status  10/15/2021 3.8 3.5 - 5.2 mmol/L Final         Passed - Na in normal range and within 180 days    Sodium  Date Value Ref Range Status  10/15/2021 141 134 - 144 mmol/L Final         Passed - Cr in normal range and within 180 days    Creatinine, Ser  Date Value Ref Range Status  10/15/2021 0.89 0.76 - 1.27 mg/dL Final         Passed - Ca in normal range and within 180 days    Calcium  Date Value Ref Range Status  10/15/2021 9.7 8.6 - 10.2 mg/dL Final         Passed - Patient is not pregnant      Passed - Last BP in normal range    BP Readings from Last 1 Encounters:  10/15/21 125/79         Passed - Valid encounter within last 6 months    Recent Outpatient Visits          1 month ago Type 2 diabetes mellitus without complication, without long-term current use of insulin ([place])   [facility] Vigg, Avanti, MD   5 months ago Sleep apnea, unspecified type   [facility] Vigg, Avanti, MD   6 months ago Diabetes mellitus without complication ([place])   [facility] Vigg, Avanti, MD   8 months ago Screening PSA (prostate specific antigen)   [facility] Vigg, Avanti, MD   8 months ago Diabetes mellitus without complication ([place])   [facility] Vigg, Avanti, MD

## 2021-11-30 ENCOUNTER — Encounter: Payer: Self-pay | Admitting: Emergency Medicine

## 2021-11-30 ENCOUNTER — Emergency Department
Admission: EM | Admit: 2021-11-30 | Discharge: 2021-11-30 | Disposition: A | Discharge status: Home / Self Care | Payer: Worker's Compensation | Attending: Emergency Medicine | Admitting: Emergency Medicine

## 2021-11-30 ENCOUNTER — Emergency Department: Payer: Worker's Compensation

## 2021-11-30 ENCOUNTER — Other Ambulatory Visit: Payer: Self-pay

## 2021-11-30 DIAGNOSIS — S8991XA Unspecified injury of right lower leg, initial encounter: Secondary | ICD-10-CM | POA: Insufficient documentation

## 2021-11-30 DIAGNOSIS — Y990 Civilian activity done for income or pay: Secondary | ICD-10-CM | POA: Insufficient documentation

## 2021-11-30 DIAGNOSIS — Z7984 Long term (current) use of oral hypoglycemic drugs: Secondary | ICD-10-CM | POA: Diagnosis not present

## 2021-11-30 DIAGNOSIS — E119 Type 2 diabetes mellitus without complications: Secondary | ICD-10-CM | POA: Insufficient documentation

## 2021-11-30 DIAGNOSIS — I10 Essential (primary) hypertension: Secondary | ICD-10-CM | POA: Insufficient documentation

## 2021-11-30 DIAGNOSIS — Z79899 Other long term (current) drug therapy: Secondary | ICD-10-CM | POA: Diagnosis not present

## 2021-11-30 DIAGNOSIS — Z794 Long term (current) use of insulin: Secondary | ICD-10-CM | POA: Diagnosis not present

## 2021-11-30 DIAGNOSIS — W19XXXA Unspecified fall, initial encounter: Secondary | ICD-10-CM

## 2021-11-30 DIAGNOSIS — W010XXA Fall on same level from slipping, tripping and stumbling without subsequent striking against object, initial encounter: Secondary | ICD-10-CM | POA: Insufficient documentation

## 2021-11-30 MED ORDER — MELOXICAM 15 MG PO TABS: 15.0000 mg | ORAL_TABLET | Freq: Every day | ORAL | 0 refills | Status: DC

## 2021-11-30 MED ORDER — MELOXICAM 7.5 MG PO TABS
15.0000 mg | ORAL_TABLET | Freq: Once | ORAL | Status: AC
  Administered 2021-11-30: 22:00:00 15 mg via ORAL
  Filled 2021-11-30: qty 2

## 2021-11-30 NOTE — ED Provider Notes (Signed)
[facility] Emergency Department Provider Note  ____________________________________________  Time seen: Approximately 9:11 PM  I have reviewed the triage vital signs and the nursing notes.   HISTORY  Chief Complaint Knee Injury    HPI Devin Arias is a 60 y.o. male who presents the emergency department complaining of right knee pain.  Patient slipped and fell while he was using a blower at work.  He landed directly on his knee.  He has a bruise over the proximal third to midshaft left tibia.  No pain in the knee joint itself, no ankle pain.  Patient is still ambulatory at this time.  No open wounds reported.       Past Medical History:  Diagnosis Date   Diabetes mellitus without complication ([place])    Hypertension     There are no problems to display for this patient.   Past Surgical History:  Procedure Laterality Date   CHOLECYSTECTOMY     COLONOSCOPY WITH PROPOFOL N/A 08/02/2021   Procedure: COLONOSCOPY WITH PROPOFOL;  Surgeon: Jonathon Bellows, MD;  Location: [facility] ENDOSCOPY;  Service: Gastroenterology;  Laterality: N/A;  PATIENT SAYS HE DOES NOT INTERPRETER    Prior to Admission medications   Medication Sig Start Date End Date Taking? Authorizing Provider  meloxicam (MOBIC) 15 MG tablet Take 1 tablet (15 mg total) by mouth daily. 11/30/21 11/30/22 Yes Meli Faley, Charline Bills, PA-C  amLODipine (NORVASC) 5 MG tablet Take 1 tablet (5 mg total) by mouth daily. 03/29/21   Vigg, Avanti, MD  atorvastatin (LIPITOR) 40 MG tablet Take 1 tablet (40 mg total) by mouth daily. 10/15/21   Vigg, Avanti, MD  cyclobenzaprine (FLEXERIL) 10 MG tablet Take 1 tablet by mouth 3 (three) times daily as needed. 02/17/21   [provider]  Dulaglutide (TRULICITY) 4.5 XH/3.7JI SOPN Inject 4.5 mg as directed once a week. 11/13/21   Vigg, Avanti, MD  ibuprofen (ADVIL) 600 MG tablet Take 600 mg by mouth 2 (two) times daily as needed. 02/01/21   [provider]   insulin glargine (LANTUS SOLOSTAR) 100 UNIT/ML Solostar Pen Inject 15 Units into the skin in the morning. 10/23/21   Vigg, Avanti, MD  Insulin Pen Needle (NOVOFINE) 30G X 8 MM MISC Inject 10 each into the skin as needed. 10/23/21   Vigg, Avanti, MD  JARDIANCE 25 MG TABS tablet TAKE 1 TABLET BY MOUTH DAILY BEFORE BREAKFAST. 08/21/21   Vigg, Avanti, MD  lidocaine (LIDODERM) 5 % PLACE 1 PATCH ONTO THE SKIN DAILY. REMOVE & DISCARD PATCH WITHIN 12 HOURS OR AS DIRECTED BY MD 08/29/21   Charlynne Cousins, MD  losartan-hydrochlorothiazide (HYZAAR) 100-25 MG tablet TAKE 1 TABLET BY MOUTH EVERY DAY 11/28/21   Vigg, Avanti, MD  metFORMIN (GLUCOPHAGE) 1000 MG tablet Take 1 tablet (1,000 mg total) by mouth 2 (two) times daily with a meal. 07/11/21   Vigg, Avanti, MD    Allergies Patient has no known allergies.  No family history on file.  Social History Social History   Tobacco Use   Smoking status: Never   Smokeless tobacco: Never  Vaping Use   Vaping Use: Never used  Substance Use Topics   Alcohol use: Never   Drug use: Never     Review of Systems  Constitutional: No fever/chills Eyes: No visual changes. No discharge ENT: No upper respiratory complaints. Cardiovascular: no chest pain. Respiratory: no cough. No SOB. Gastrointestinal: No abdominal pain.  No nausea, no vomiting.  No diarrhea.  No constipation. Musculoskeletal: Knee/leg injury Skin:  Negative for rash, abrasions, lacerations, ecchymosis. Neurological: Negative for headaches, focal weakness or numbness.  10 System ROS otherwise negative.  ____________________________________________   PHYSICAL EXAM:  VITAL SIGNS: ED Triage Vitals  Enc Vitals Group     BP 11/30/21 1934 108/73     Pulse Rate 11/30/21 1934 68     Resp 11/30/21 1934 20     Temp 11/30/21 1934 98.9 F (37.2 C)     Temp Source 11/30/21 1934 Oral     SpO2 11/30/21 1934 96 %     Weight 11/30/21 1934 200 lb (90.7 kg)     Height 11/30/21 1934 5\' 7"  (1.702 m)      Head Circumference --      Peak Flow --      Pain Score 11/30/21 1952 6     Pain Loc --      Pain Edu? --      Excl. in [place]? --      Constitutional: Alert and oriented. Well appearing and in no acute distress. Eyes: Conjunctivae are normal. PERRL. EOMI. Head: Atraumatic. ENT:      Ears:       Nose: No congestion/rhinnorhea.      Mouth/Throat: Mucous membranes are moist.  Neck: No stridor.    Cardiovascular: Normal rate, regular rhythm. Normal S1 and S2.  Good peripheral circulation. Respiratory: Normal respiratory effort without tachypnea or retractions. Lungs CTAB. Good air entry to the bases with no decreased or absent breath sounds. Musculoskeletal: Full range of motion to all extremities. No gross deformities appreciated.  Visualization of the right shin reveals an area of ecchymosis roughly midshaft.  Palpation reveals no tenderness of the knee or ankle joints.  Full range of motion to both joints.  Tender over the tibia.  Patient is able to ambulate without a limp.  Dorsalis pedis pulses sensation intact distally Neurologic:  Normal speech and language. No gross focal neurologic deficits are appreciated.  Skin:  Skin is warm, dry and intact. No rash noted. Psychiatric: Mood and affect are normal. Speech and behavior are normal. Patient exhibits appropriate insight and judgement.   ____________________________________________   LABS (all labs ordered are listed, but only abnormal results are displayed)  Labs Reviewed - No data to display ____________________________________________  EKG   ____________________________________________  RADIOLOGY I personally viewed and evaluated these images as part of my medical decision making, as well as reviewing the written report by the radiologist.  ED Provider Interpretation: No acute musculoskeletal findings on x-ray  DG Knee Complete 4 Views Right  Result Date: 11/30/2021 CLINICAL DATA:  Right knee pain after a fall EXAM:  RIGHT KNEE - COMPLETE 4+ VIEW COMPARISON:  None. FINDINGS: Old appearing deformity of the right lateral tibial plateau with growth arrest line suggesting old injury. No evidence of acute fracture or dislocation. The bone cortex appears intact. No focal bone lesion or bone destruction. Joint spaces are normal. No significant effusions. Soft tissues are unremarkable. IMPRESSION: Probable old deformity of the lateral tibial plateau. No acute displaced fractures identified. Electronically Signed   By: Lucienne Capers M.D.   On: 11/30/2021 20:51    ____________________________________________    PROCEDURES  Procedure(s) performed:    Procedures    Medications  meloxicam (MOBIC) tablet 15 mg (has no administration in time range)     ____________________________________________   INITIAL IMPRESSION / ASSESSMENT AND PLAN / ED COURSE  Pertinent labs & imaging results that were available during my care of the patient were reviewed by  me and considered in my medical decision making (see chart for details).  Review of the [place] CSRS was performed in accordance of the [place] prior to dispensing any controlled drugs.           Patient's diagnosis is consistent with a condition patient presented to the emergency department after tripping and falling and striking his right leg on the ground.  Patient has an area of ecchymosis and edema mid tibia.  This consistent with contusion.  No evidence of fracture on imaging.  Patient is ambulatory at this time.  Symptom control medication prescribed for the patient.  Follow-up primary care as needed..  Patient is given ED precautions to return to the ED for any worsening or new symptoms.     ____________________________________________  FINAL CLINICAL IMPRESSION(S) / ED DIAGNOSES  Final diagnoses:  Injury of right shin, initial encounter      NEW MEDICATIONS STARTED DURING THIS VISIT:  ED Discharge Orders          Ordered    meloxicam (MOBIC)  15 MG tablet  Daily        11/30/21 2119                This chart was dictated using voice recognition software/Dragon. Despite best efforts to proofread, errors can occur which can change the meaning. Any change was purely unintentional.    Darletta Moll, PA-C 11/30/21 2121    Naaman Plummer, MD 12/01/21 [phone]

## 2021-11-30 NOTE — ED Notes (Signed)
Pt injured right knee at work today

## 2021-11-30 NOTE — ED Notes (Signed)
Pt discharge information reviewed. Pt understands importance for need of follow up care, and when to return if symptoms worsen.  Pt understands all information. All questions answered. Pt seen ambulating out of department with strong steady gait.

## 2021-11-30 NOTE — ED Triage Notes (Signed)
Pt reports he fell this morning while at work. Denies hitting his head. Pt talks in complete sentences no respiratory distress noted

## 2021-12-01 ENCOUNTER — Telehealth: Payer: Self-pay

## 2021-12-01 NOTE — Telephone Encounter (Signed)
Transition Care Management Unsuccessful Follow-up Telephone Call  Date of discharge and from where:  11/30/2021-[facility]  Attempts:  1st Attempt  Reason for unsuccessful TCM follow-up call:  Left voice message

## 2021-12-03 NOTE — Telephone Encounter (Signed)
Transition Care Management Unsuccessful Follow-up Telephone Call  Date of discharge and from where:  11/30/2021-[facility]  Attempts:  2nd Attempt  Reason for unsuccessful TCM follow-up call:  Left voice message

## 2021-12-04 NOTE — Telephone Encounter (Signed)
Transition Care Management Follow-up Telephone Call Date of discharge and from where: 11/30/2021 from [facility] How have you been since you were released from the hospital? Pt stated that he is feeling better with a little soreness. Pt did not have any questions or concerns at this time.  Any questions or concerns? No  Items Reviewed: Did the pt receive and understand the discharge instructions provided? Yes  Medications obtained and verified? Yes  Other? No  Any new allergies since your discharge? No  Dietary orders reviewed? No Do you have support at home? Yes   Functional Questionnaire: (I = Independent and D = Dependent) ADLs: I  Bathing/Dressing- I  Meal Prep- I  Eating- I  Maintaining continence- I  Transferring/Ambulation- I  Managing Meds- I   Follow up appointments reviewed:  PCP Hospital f/u appt confirmed? No   Specialist Hospital f/u appt confirmed? No   Are transportation arrangements needed? No  If their condition worsens, is the pt aware to call PCP or go to the Emergency Dept.? Yes Was the patient provided with contact information for the PCP's office or ED? Yes Was to pt encouraged to call back with questions or concerns? Yes

## 2021-12-08 DIAGNOSIS — I10 Essential (primary) hypertension: Secondary | ICD-10-CM | POA: Diagnosis not present

## 2021-12-08 DIAGNOSIS — G4733 Obstructive sleep apnea (adult) (pediatric): Secondary | ICD-10-CM | POA: Diagnosis not present

## 2021-12-22 ENCOUNTER — Other Ambulatory Visit: Payer: Self-pay | Admitting: Internal Medicine

## 2021-12-22 DIAGNOSIS — E119 Type 2 diabetes mellitus without complications: Secondary | ICD-10-CM

## 2021-12-22 NOTE — Telephone Encounter (Signed)
Requested Prescriptions  Pending Prescriptions Disp Refills   metFORMIN (GLUCOPHAGE) 1000 MG tablet [Pharmacy Med Name: METFORMIN HCL 1,000 MG TABLET] 180 tablet 1    Sig: TAKE 1 TABLET (1,000 MG TOTAL) BY MOUTH 2 (TWO) TIMES DAILY WITH A MEAL.     Endocrinology:  Diabetes - Biguanides Failed - 12/22/2021  5:58 PM      Failed - HBA1C is between 0 and 7.9 and within 180 days    HB A1C (BAYER DCA - WAIVED)  Date Value Ref Range Status  10/15/2021 8.8 (H) 4.8 - 5.6 % Final    Comment:             Prediabetes: 5.7 - 6.4          Diabetes: >6.4          Glycemic control for adults with diabetes: <7.0          Passed - Cr in normal range and within 360 days    Creatinine, Ser  Date Value Ref Range Status  10/15/2021 0.89 0.76 - 1.27 mg/dL Final         Passed - eGFR in normal range and within 360 days    eGFR  Date Value Ref Range Status  10/15/2021 98 >59 mL/min/1.73 Final         Passed - Valid encounter within last 6 months    Recent Outpatient Visits          2 months ago Type 2 diabetes mellitus without complication, without long-term current use of insulin ([place])   [facility] Vigg, Avanti, MD   5 months ago Sleep apnea, unspecified type   [facility] Vigg, Avanti, MD   7 months ago Diabetes mellitus without complication ([place])   [facility] Vigg, Avanti, MD   8 months ago Screening PSA (prostate specific antigen)   [facility] Vigg, Avanti, MD   9 months ago Diabetes mellitus without complication ([place])   [facility] Vigg, Avanti, MD             \

## 2021-12-26 ENCOUNTER — Telehealth: Payer: Self-pay

## 2021-12-26 ENCOUNTER — Encounter: Payer: Self-pay | Admitting: Internal Medicine

## 2021-12-26 ENCOUNTER — Other Ambulatory Visit: Payer: Self-pay | Admitting: Internal Medicine

## 2021-12-26 ENCOUNTER — Other Ambulatory Visit: Payer: Self-pay

## 2021-12-26 ENCOUNTER — Ambulatory Visit (INDEPENDENT_AMBULATORY_CARE_PROVIDER_SITE_OTHER): Payer: Medicaid Other | Admitting: Internal Medicine

## 2021-12-26 VITALS — BP 129/79 | HR 71 | Temp 98.1°F | Ht 66.93 in | Wt 196.2 lb

## 2021-12-26 DIAGNOSIS — E119 Type 2 diabetes mellitus without complications: Secondary | ICD-10-CM | POA: Diagnosis not present

## 2021-12-26 LAB — BAYER DCA HB A1C WAIVED: HB A1C (BAYER DCA - WAIVED): 6.4 % — ABNORMAL HIGH (ref 4.8–5.6)

## 2021-12-26 MED ORDER — ATORVASTATIN CALCIUM 40 MG PO TABS: 40.0000 mg | ORAL_TABLET | Freq: Every day | ORAL | 2 refills | Status: DC

## 2021-12-26 MED ORDER — AMLODIPINE BESYLATE 5 MG PO TABS: 5.0000 mg | ORAL_TABLET | Freq: Every day | ORAL | 3 refills | Status: DC

## 2021-12-26 MED ORDER — RYBELSUS 3 MG PO TABS: 3.0000 mg | ORAL_TABLET | Freq: Every day | ORAL | 1 refills | Status: DC

## 2021-12-26 MED ORDER — MELOXICAM 15 MG PO TABS: 15.0000 mg | ORAL_TABLET | Freq: Every day | ORAL | 0 refills | Status: DC

## 2021-12-26 MED ORDER — LOSARTAN POTASSIUM-HCTZ 100-25 MG PO TABS: 1.0000 | ORAL_TABLET | Freq: Every day | ORAL | 0 refills | Status: DC

## 2021-12-26 MED ORDER — MELOXICAM 15 MG PO TABS
15.0000 mg | ORAL_TABLET | Freq: Every day | ORAL | 0 refills | Status: DC
Start: 2021-12-26 — End: 2022-08-14

## 2021-12-26 NOTE — Telephone Encounter (Addendum)
Na

## 2021-12-26 NOTE — Telephone Encounter (Signed)
Pt called in stating that medications that were [facility]illed [facility]. Pt was at [facility] pharmacy waiting on meds. I was able to resend to [facility] pharmacy and was pt notified.

## 2021-12-26 NOTE — Telephone Encounter (Addendum)
[facility] and [facility] to send to [facility]. Medication has been sent to both locations.   Requested Prescriptions  Refused Prescriptions Disp Refills   RYBELSUS 3 MG TABS [Pharmacy Med Name: RYBELSUS 3 MG TABLET] 30 tablet 1    Sig: TAKE 3 MG BY MOUTH DAILY.     Off-Protocol Failed - 12/26/2021 12:02 PM      Failed - Medication not assigned to a protocol, review manually.      Passed - Valid encounter within last 12 months    Recent Outpatient Visits           Today Diabetes mellitus without complication ([place])   [facility] Vigg, Avanti, MD   2 months ago Type 2 diabetes mellitus without complication, without long-term current use of insulin ([place])   [facility] Vigg, Avanti, MD   6 months ago Sleep apnea, unspecified type   [facility] Vigg, Avanti, MD   7 months ago Diabetes mellitus without complication ([place])   [facility] Vigg, Avanti, MD   9 months ago Screening PSA (prostate specific antigen)   [facility] Vigg, Avanti, MD       Future Appointments             In 1 month Vigg, Avanti, MD [facility]

## 2021-12-26 NOTE — Progress Notes (Signed)
BP 129/79    Pulse 71    Temp 98.1 F (36.7 C) (Oral)    Ht 5' 6.93" (1.7 m)    Wt 196 lb 3.2 oz (89 kg)    SpO2 97%    BMI 30.79 kg/m    Subjective:    Patient ID: Devin Arias, male    DOB: 05/04/1961, 61 y.o.   MRN: [id number]  Chief Complaint  Patient presents with   Diabetes   U/S of Liver    Never done, referral note states he no showed 2 appts. On on 11/29 and 12/12. Patient states he was never notified. Looked in chart no notes stating he was informed of above appt.   hands and feet    Patient states that his hands and feet are going numb and hurt .    HPI: Devin Arias is a 61 y.o. male  Diabetes He presents for his follow-up (has had) diabetic visit. He has type 2 diabetes mellitus. Pertinent negatives for hypoglycemia include no confusion, dizziness, headaches, speech difficulty or tremors. Pertinent negatives for diabetes include no chest pain, no fatigue, no polydipsia, no polyphagia, no polyuria and no weakness.   Chief Complaint  Patient presents with   Diabetes   U/S of Liver    Never done, referral note states he no showed 2 appts. On on 11/29 and 12/12. Patient states he was never notified. Looked in chart no notes stating he was informed of above appt.   hands and feet    Patient states that his hands and feet are going numb and hurt .    Relevant past medical, surgical, family and social history reviewed and updated as indicated. Interim medical history since our last visit reviewed. Allergies and medications reviewed and updated.  Review of Systems  Constitutional:  Negative for activity change, appetite change, chills, fatigue and fever.  HENT:  Negative for congestion, ear discharge, ear pain and facial swelling.   Eyes:  Negative for pain, discharge and itching.  Respiratory:  Negative for cough, chest tightness, shortness of breath and wheezing.   Cardiovascular:  Negative for chest pain, palpitations and leg swelling.   Gastrointestinal:  Negative for abdominal distention, abdominal pain, blood in stool, constipation, diarrhea, nausea and vomiting.  Endocrine: Negative for cold intolerance, heat intolerance, polydipsia, polyphagia and polyuria.  Genitourinary:  Negative for difficulty urinating, dysuria, flank pain, frequency, hematuria and urgency.  Musculoskeletal:  Negative for arthralgias, gait problem, joint swelling and myalgias.  Skin:  Negative for color change, rash and wound.  Neurological:  Negative for dizziness, tremors, speech difficulty, weakness, light-headedness, numbness and headaches.  Hematological:  Does not bruise/bleed easily.  Psychiatric/Behavioral:  Negative for agitation, confusion, decreased concentration, sleep disturbance and suicidal ideas.    Per HPI unless specifically indicated above     Objective:    BP 129/79    Pulse 71    Temp 98.1 F (36.7 C) (Oral)    Ht 5' 6.93" (1.7 m)    Wt 196 lb 3.2 oz (89 kg)    SpO2 97%    BMI 30.79 kg/m   Wt Readings from Last 3 Encounters:  12/26/21 196 lb 3.2 oz (89 kg)  11/30/21 200 lb (90.7 kg)  10/15/21 202 lb (91.6 kg)    Physical Exam Vitals and nursing note reviewed.  Constitutional:      General: He is not in acute distress.    Appearance: Normal appearance. He is not ill-appearing or diaphoretic.  HENT:  Head: Normocephalic and atraumatic.     Right Ear: Tympanic membrane and external ear normal. There is no impacted cerumen.     Left Ear: External ear normal.     Nose: No congestion or rhinorrhea.     Mouth/Throat:     Pharynx: No oropharyngeal exudate or posterior oropharyngeal erythema.  Eyes:     Conjunctiva/sclera: Conjunctivae normal.     Pupils: Pupils are equal, round, and reactive to light.  Cardiovascular:     Rate and Rhythm: Normal rate and regular rhythm.     Heart sounds: No murmur heard.   No friction rub. No gallop.  Pulmonary:     Effort: No respiratory distress.     Breath sounds: No stridor.  No wheezing or rhonchi.  Chest:     Chest wall: No tenderness.  Abdominal:     General: Abdomen is flat. Bowel sounds are normal.     Palpations: Abdomen is soft. There is no mass.     Tenderness: There is no abdominal tenderness.  Musculoskeletal:     Cervical back: Normal range of motion and neck supple. No rigidity or tenderness.     Left lower leg: No edema.  Skin:    General: Skin is warm and dry.  Neurological:     Mental Status: He is alert.  Psychiatric:        Mood and Affect: Mood normal.        Behavior: Behavior normal.        Thought Content: Thought content normal.    Results for orders placed or performed in visit on 10/15/21  Bayer DCA Hb A1c Waived (STAT)  Result Value Ref Range   HB A1C (BAYER DCA - WAIVED) 8.8 (H) 4.8 - 5.6 %  Hepatitis B core antibody, IgM  Result Value Ref Range   Hep B C IgM Negative Negative  Hepatitis B Surface AntiGEN  Result Value Ref Range   Hepatitis B Surface Ag Negative Negative  Hepatitis A antibody, total  Result Value Ref Range   hep A Total Ab Positive (A) Negative  Hepatitis C Antibody  Result Value Ref Range   Hep C Virus Ab <0.1 0.0 - 0.9 s/co ratio  Comprehensive metabolic panel  Result Value Ref Range   Glucose 176 (H) 70 - 99 mg/dL   BUN 11 8 - 27 mg/dL   Creatinine, Ser 0.89 0.76 - 1.27 mg/dL   eGFR 98 >59 mL/min/1.73   BUN/Creatinine Ratio 12 10 - 24   Sodium 141 134 - 144 mmol/L   Potassium 3.8 3.5 - 5.2 mmol/L   Chloride 101 96 - 106 mmol/L   CO2 23 20 - 29 mmol/L   Calcium 9.7 8.6 - 10.2 mg/dL   Total Protein 7.4 6.0 - 8.5 g/dL   Albumin 4.5 3.8 - 4.9 g/dL   Globulin, Total 2.9 1.5 - 4.5 g/dL   Albumin/Globulin Ratio 1.6 1.2 - 2.2   Bilirubin Total 0.5 0.0 - 1.2 mg/dL   Alkaline Phosphatase 184 (H) 44 - 121 IU/L   AST 62 (H) 0 - 40 IU/L   ALT 88 (H) 0 - 44 IU/L        Current Outpatient Medications:    cyclobenzaprine (FLEXERIL) 10 MG tablet, Take 1 tablet by mouth 3 (three) times daily as  needed., Disp: , Rfl:    ibuprofen (ADVIL) 600 MG tablet, Take 600 mg by mouth 2 (two) times daily as needed., Disp: , Rfl:    Insulin Pen Needle (  NOVOFINE) 30G X 8 MM MISC, Inject 10 each into the skin as needed., Disp: 3 packet, Rfl: 2   JARDIANCE 25 MG TABS tablet, TAKE 1 TABLET BY MOUTH DAILY BEFORE BREAKFAST., Disp: 90 tablet, Rfl: 2   lidocaine (LIDODERM) 5 %, PLACE 1 PATCH ONTO THE SKIN DAILY. REMOVE & DISCARD PATCH WITHIN 12 HOURS OR AS DIRECTED BY MD, Disp: 30 patch, Rfl: 1   metFORMIN (GLUCOPHAGE) 1000 MG tablet, TAKE 1 TABLET (1,000 MG TOTAL) BY MOUTH 2 (TWO) TIMES DAILY WITH A MEAL., Disp: 180 tablet, Rfl: 1   amLODipine (NORVASC) 5 MG tablet, Take 1 tablet (5 mg total) by mouth daily., Disp: 90 tablet, Rfl: 3   atorvastatin (LIPITOR) 40 MG tablet, Take 1 tablet (40 mg total) by mouth daily., Disp: 30 tablet, Rfl: 2   losartan-hydrochlorothiazide (HYZAAR) 100-25 MG tablet, Take 1 tablet by mouth daily., Disp: 90 tablet, Rfl: 0   meloxicam (MOBIC) 15 MG tablet, Take 1 tablet (15 mg total) by mouth daily., Disp: 30 tablet, Rfl: 0   Semaglutide (RYBELSUS) 3 MG TABS, Take 3 mg by mouth daily., Disp: 30 tablet, Rfl: 1    Assessment & Plan:  DM will start pt on rybelsus 3 mg x 1 month --- then increase to 7 mg Couldn't get trulcity sec to shortage Is on 15 untis of lantus and jardiance 25 mg.  A1c much improved hold Lantus  Start rybelsus.  check HbA1c,  urine  microalbumin  diabetic diet plan given to pt  adviced regarding hypoglycemia and instructions given to pt today on how to prevent and treat the same if it were to occur. pt acknowledges the plan and voices understanding of the same.  exercise plan given and encouraged.   advice diabetic yearly podiatry, ophthalmology , nutritionist , dental check q 6 months   2. Elevated [place] phosphatase/ ALT/ AST was scheduled for RUQ [place] didn't have such done Will recheck/ order   Problem List Items Addressed This Visit   None Visit  Diagnoses     Diabetes mellitus without complication ([place])    -  Primary   Relevant Orders   Bayer DCA Hb A1c Waived (STAT)   Comp Met (CMET)   CBC with Differential/Platelet   Lipid panel        Orders Placed This Encounter  Procedures   Bayer DCA Hb A1c Waived (STAT)   Comp Met (CMET)   CBC with Differential/Platelet   Lipid panel     Meds ordered this encounter  Medications   DISCONTD: meloxicam (MOBIC) 15 MG tablet    Sig: Take 1 tablet (15 mg total) by mouth daily.    Dispense:  30 tablet    Refill:  0   DISCONTD: amLODipine (NORVASC) 5 MG tablet    Sig: Take 1 tablet (5 mg total) by mouth daily.    Dispense:  90 tablet    Refill:  3   DISCONTD: atorvastatin (LIPITOR) 40 MG tablet    Sig: Take 1 tablet (40 mg total) by mouth daily.    Dispense:  30 tablet    Refill:  2   DISCONTD: losartan-hydrochlorothiazide (HYZAAR) 100-25 MG tablet    Sig: Take 1 tablet by mouth daily.    Dispense:  90 tablet    Refill:  0   DISCONTD: Semaglutide (RYBELSUS) 3 MG TABS    Sig: Take 3 mg by mouth daily.    Dispense:  30 tablet    Refill:  1  Follow up plan: No follow-ups on file.

## 2021-12-27 LAB — COMPREHENSIVE METABOLIC PANEL
ALT: 35 IU/L (ref 0–44)
AST: 23 IU/L (ref 0–40)
Albumin/Globulin Ratio: 1.6 (ref 1.2–2.2)
Albumin: 4.6 g/dL (ref 3.8–4.9)
Alkaline Phosphatase: 137 IU/L — ABNORMAL HIGH (ref 44–121)
BUN/Creatinine Ratio: 15 (ref 10–24)
BUN: 13 mg/dL (ref 8–27)
Bilirubin Total: 0.5 mg/dL (ref 0.0–1.2)
CO2: 24 mmol/L (ref 20–29)
Calcium: 9.6 mg/dL (ref 8.6–10.2)
Chloride: 99 mmol/L (ref 96–106)
Creatinine, Ser: 0.87 mg/dL (ref 0.76–1.27)
Globulin, Total: 2.9 g/dL (ref 1.5–4.5)
Glucose: 269 mg/dL — ABNORMAL HIGH (ref 70–99)
Potassium: 3.7 mmol/L (ref 3.5–5.2)
Sodium: 140 mmol/L (ref 134–144)
Total Protein: 7.5 g/dL (ref 6.0–8.5)
eGFR: 99 mL/min/{1.73_m2} (ref >59)

## 2021-12-27 LAB — CBC WITH DIFFERENTIAL/PLATELET
Basophils Absolute: 0 10*3/uL (ref 0.0–0.2)
Basos: 0 %
EOS (ABSOLUTE): 0.3 10*3/uL (ref 0.0–0.4)
Eos: 4 %
Hematocrit: 48.4 % (ref 37.5–51.0)
Hemoglobin: 16.2 g/dL (ref 13.0–17.7)
Immature Grans (Abs): 0 10*3/uL (ref 0.0–0.1)
Immature Granulocytes: 0 %
Lymphocytes Absolute: 2 10*3/uL (ref 0.7–3.1)
Lymphs: 28 %
MCH: 29.1 pg (ref 26.6–33.0)
MCHC: 33.5 g/dL (ref 31.5–35.7)
MCV: 87 fL (ref 79–97)
Monocytes Absolute: 0.5 10*3/uL (ref 0.1–0.9)
Monocytes: 7 %
Neutrophils Absolute: 4.2 10*3/uL (ref 1.4–7.0)
Neutrophils: 61 %
Platelets: 228 10*3/uL (ref 150–450)
RBC: 5.56 x10E6/uL (ref 4.14–5.80)
RDW: 13 % (ref 11.6–15.4)
WBC: 7 10*3/uL (ref 3.4–10.8)

## 2021-12-28 ENCOUNTER — Other Ambulatory Visit: Payer: Self-pay | Admitting: Internal Medicine

## 2021-12-28 DIAGNOSIS — E119 Type 2 diabetes mellitus without complications: Secondary | ICD-10-CM

## 2021-12-28 MED ORDER — METFORMIN HCL 1000 MG PO TABS: 1000.0000 mg | ORAL_TABLET | Freq: Two times a day (BID) | ORAL | 1 refills | Status: DC

## 2021-12-28 MED ORDER — EMPAGLIFLOZIN 25 MG PO TABS: 25.0000 mg | ORAL_TABLET | Freq: Every day | ORAL | 1 refills | Status: DC

## 2021-12-28 NOTE — Telephone Encounter (Signed)
Change in pharmacy- remainder of Rx forwarded and diagnosis code provider per chart. E11.9 Requested Prescriptions  Pending Prescriptions Disp Refills   metFORMIN (GLUCOPHAGE) 1000 MG tablet 180 tablet 1    Sig: Take 1 tablet (1,000 mg total) by mouth 2 (two) times daily with a meal.     Endocrinology:  Diabetes - Biguanides Passed - 12/28/2021  1:44 PM      Passed - Cr in normal range and within 360 days    Creatinine, Ser  Date Value Ref Range Status  12/26/2021 0.87 0.76 - 1.27 mg/dL Final         Passed - HBA1C is between 0 and 7.9 and within 180 days    HB A1C (BAYER DCA - WAIVED)  Date Value Ref Range Status  12/26/2021 6.4 (H) 4.8 - 5.6 % Final    Comment:             Prediabetes: 5.7 - 6.4          Diabetes: >6.4          Glycemic control for adults with diabetes: <7.0          Passed - eGFR in normal range and within 360 days    eGFR  Date Value Ref Range Status  12/26/2021 99 >59 mL/min/1.73 Final         Passed - Valid encounter within last 6 months    Recent Outpatient Visits          2 days ago Diabetes mellitus without complication ([place])   [facility] Vigg, Avanti, MD   2 months ago Type 2 diabetes mellitus without complication, without long-term current use of insulin ([place])   [facility] Vigg, Avanti, MD   6 months ago Sleep apnea, unspecified type   [facility] Vigg, Avanti, MD   7 months ago Diabetes mellitus without complication ([place])   [facility] Vigg, Avanti, MD   9 months ago Screening PSA (prostate specific antigen)   [place] Vigg, Avanti, MD      Future Appointments            In 1 month Vigg, Avanti, MD [facility]            empagliflozin (JARDIANCE) 25 MG TABS tablet 90 tablet 1    Sig: Take 1 tablet (25 mg total) by mouth daily before breakfast.     Endocrinology:  Diabetes - SGLT2 Inhibitors Passed - 12/28/2021  1:44 PM      Passed - Cr in  normal range and within 360 days    Creatinine, Ser  Date Value Ref Range Status  12/26/2021 0.87 0.76 - 1.27 mg/dL Final         Passed - LDL in normal range and within 360 days    LDL Chol Calc (NIH)  Date Value Ref Range Status  10/12/2021 45 0 - 99 mg/dL Final         Passed - HBA1C is between 0 and 7.9 and within 180 days    HB A1C (BAYER DCA - WAIVED)  Date Value Ref Range Status  12/26/2021 6.4 (H) 4.8 - 5.6 % Final    Comment:             Prediabetes: 5.7 - 6.4          Diabetes: >6.4          Glycemic control for adults with diabetes: <7.0  Passed - eGFR in normal range and within 360 days    eGFR  Date Value Ref Range Status  12/26/2021 99 >59 mL/min/1.73 Final         Passed - Valid encounter within last 6 months    Recent Outpatient Visits          2 days ago Diabetes mellitus without complication ([place])   [facility] Vigg, Avanti, MD   2 months ago Type 2 diabetes mellitus without complication, without long-term current use of insulin ([place])   [facility] Vigg, Avanti, MD   6 months ago Sleep apnea, unspecified type   [facility] Vigg, Avanti, MD   7 months ago Diabetes mellitus without complication ([place])   [facility] Vigg, Avanti, MD   9 months ago Screening PSA (prostate specific antigen)   [place] Vigg, Avanti, MD      Future Appointments            In 1 month Vigg, Avanti, MD [facility]

## 2021-12-28 NOTE — Telephone Encounter (Signed)
Medication Refill - Medication: metFORMIN (GLUCOPHAGE) 1000 MG tablet JARDIANCE 25 MG TABS tablet Has the patient contacted their pharmacy? Yes.   Pharmacy calling b/c they [facility] except [facility].  Would like them sent over  Preferred Pharmacy (with phone number or street name): [facility] [address] - [place], [place] - [address] AT [address] Has the patient been seen for an appointment in the last year OR does the patient have an upcoming appointment? Yes.    Agent: Please be advised that RX refills may take up to 3 business days. We ask that you follow-up with your pharmacy.

## 2021-12-31 ENCOUNTER — Telehealth: Payer: Self-pay

## 2021-12-31 NOTE — Telephone Encounter (Signed)
PA for Rybelsus 3mg  has been approved

## 2022-01-08 DIAGNOSIS — G4733 Obstructive sleep apnea (adult) (pediatric): Secondary | ICD-10-CM | POA: Diagnosis not present

## 2022-01-08 DIAGNOSIS — I10 Essential (primary) hypertension: Secondary | ICD-10-CM | POA: Diagnosis not present

## 2022-02-05 DIAGNOSIS — I10 Essential (primary) hypertension: Secondary | ICD-10-CM | POA: Diagnosis not present

## 2022-02-05 DIAGNOSIS — G4733 Obstructive sleep apnea (adult) (pediatric): Secondary | ICD-10-CM | POA: Diagnosis not present

## 2022-02-06 ENCOUNTER — Other Ambulatory Visit (INDEPENDENT_AMBULATORY_CARE_PROVIDER_SITE_OTHER): Payer: Medicaid Other

## 2022-02-06 ENCOUNTER — Other Ambulatory Visit: Payer: Self-pay

## 2022-02-06 ENCOUNTER — Other Ambulatory Visit: Payer: Self-pay | Admitting: Internal Medicine

## 2022-02-06 DIAGNOSIS — E119 Type 2 diabetes mellitus without complications: Secondary | ICD-10-CM | POA: Diagnosis not present

## 2022-02-07 LAB — LIPID PANEL
Chol/HDL Ratio: 2.6 ratio (ref 0.0–5.0)
Cholesterol, Total: 119 mg/dL (ref 100–199)
HDL: 45 mg/dL (ref >39)
LDL Chol Calc (NIH): 48 mg/dL (ref 0–99)
Triglycerides: 153 mg/dL — ABNORMAL HIGH (ref 0–149)
VLDL Cholesterol Cal: 26 mg/dL (ref 5–40)

## 2022-02-07 NOTE — Telephone Encounter (Signed)
Requested medication (s) are due for refill today: requesting early filled 02/04/22 ? ?Requested medication (s) are on the active medication list: yes ? ?Last refill:  12/26/21 #30 with 1 RF ? ?Future visit scheduled: 02/13/22 ? ?Notes to clinic:  there is not a protocol to follow for this med, please assess. ? ? ?  ? ?Requested Prescriptions  ?Pending Prescriptions Disp Refills  ? RYBELSUS 3 MG TABS [Pharmacy Med Name: RYBELSUS 3 MG TAB[*^]] 30 tablet 1  ?  Sig: TAKE ONE TABLET ('3MG'$  TOTAL) BY MOUTH ONE TIME DAILY  ?  ? Off-Protocol Failed - 02/06/2022  9:30 AM  ?  ?  Failed - Medication not assigned to a protocol, review manually.  ?  ?  Passed - Valid encounter within last 12 months  ?  Recent Outpatient Visits   ? ?      ? 1 month ago Diabetes mellitus without complication ([place])  ? [facility] Vigg, Avanti, MD  ? 3 months ago Type 2 diabetes mellitus without complication, without long-term current use of insulin ([place])  ? [facility] Vigg, Avanti, MD  ? 7 months ago Sleep apnea, unspecified type  ? [facility] Vigg, Avanti, MD  ? 8 months ago Diabetes mellitus without complication ([place])  ? [facility] Vigg, Avanti, MD  ? 10 months ago Screening PSA (prostate specific antigen)  ? [facility] Vigg, Avanti, MD  ? ?  ?  ?Future Appointments   ? ?        ? In 6 days Vigg, Avanti, MD [facility]  ? ?  ? ?  ?  ?  ? ? ?

## 2022-02-13 ENCOUNTER — Encounter: Payer: Self-pay | Admitting: Internal Medicine

## 2022-02-13 ENCOUNTER — Other Ambulatory Visit: Payer: Self-pay

## 2022-02-13 ENCOUNTER — Ambulatory Visit (INDEPENDENT_AMBULATORY_CARE_PROVIDER_SITE_OTHER): Payer: Medicaid Other | Admitting: Internal Medicine

## 2022-02-13 VITALS — BP 123/69 | HR 72 | Temp 98.5°F | Ht 66.93 in | Wt 196.0 lb

## 2022-02-13 DIAGNOSIS — E119 Type 2 diabetes mellitus without complications: Secondary | ICD-10-CM | POA: Diagnosis not present

## 2022-02-13 LAB — BAYER DCA HB A1C WAIVED: HB A1C (BAYER DCA - WAIVED): 8.3 % — ABNORMAL HIGH (ref 4.8–5.6)

## 2022-02-13 IMAGING — CR DG KNEE COMPLETE 4+V*R*
1 series · 4 of 4 positions shown · non-contrast
Comparison: None.

CLINICAL DATA: Right knee pain after a fall

EXAM:
RIGHT KNEE - COMPLETE 4+ VIEW

[Series 1: dg knee complete 4 views left · 0.14mm/px · 4 of 4 slices shown]
[im 1/4]
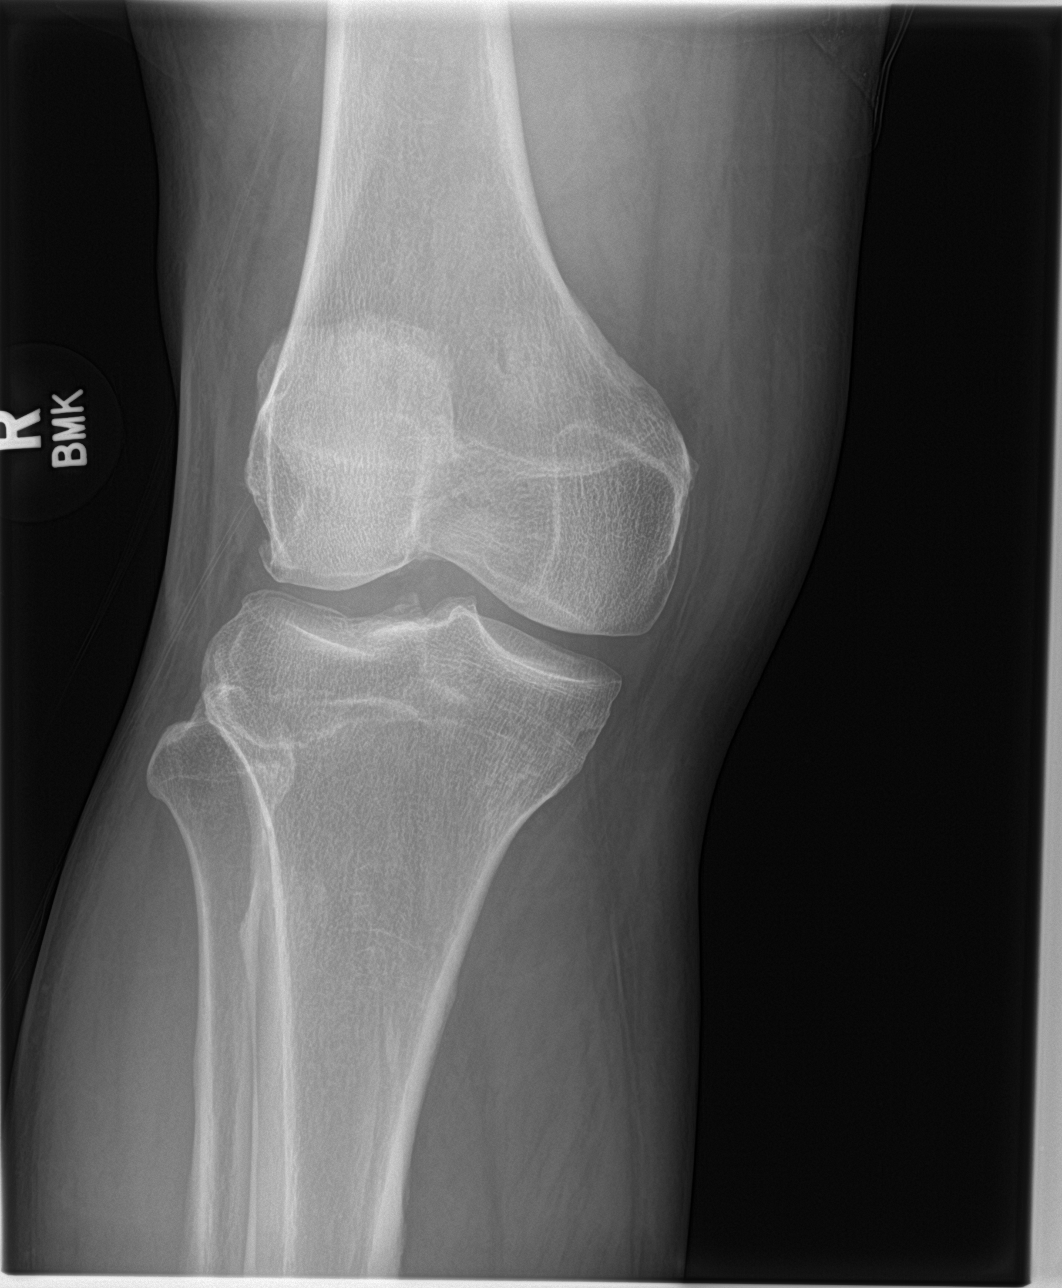
[im 2/4]
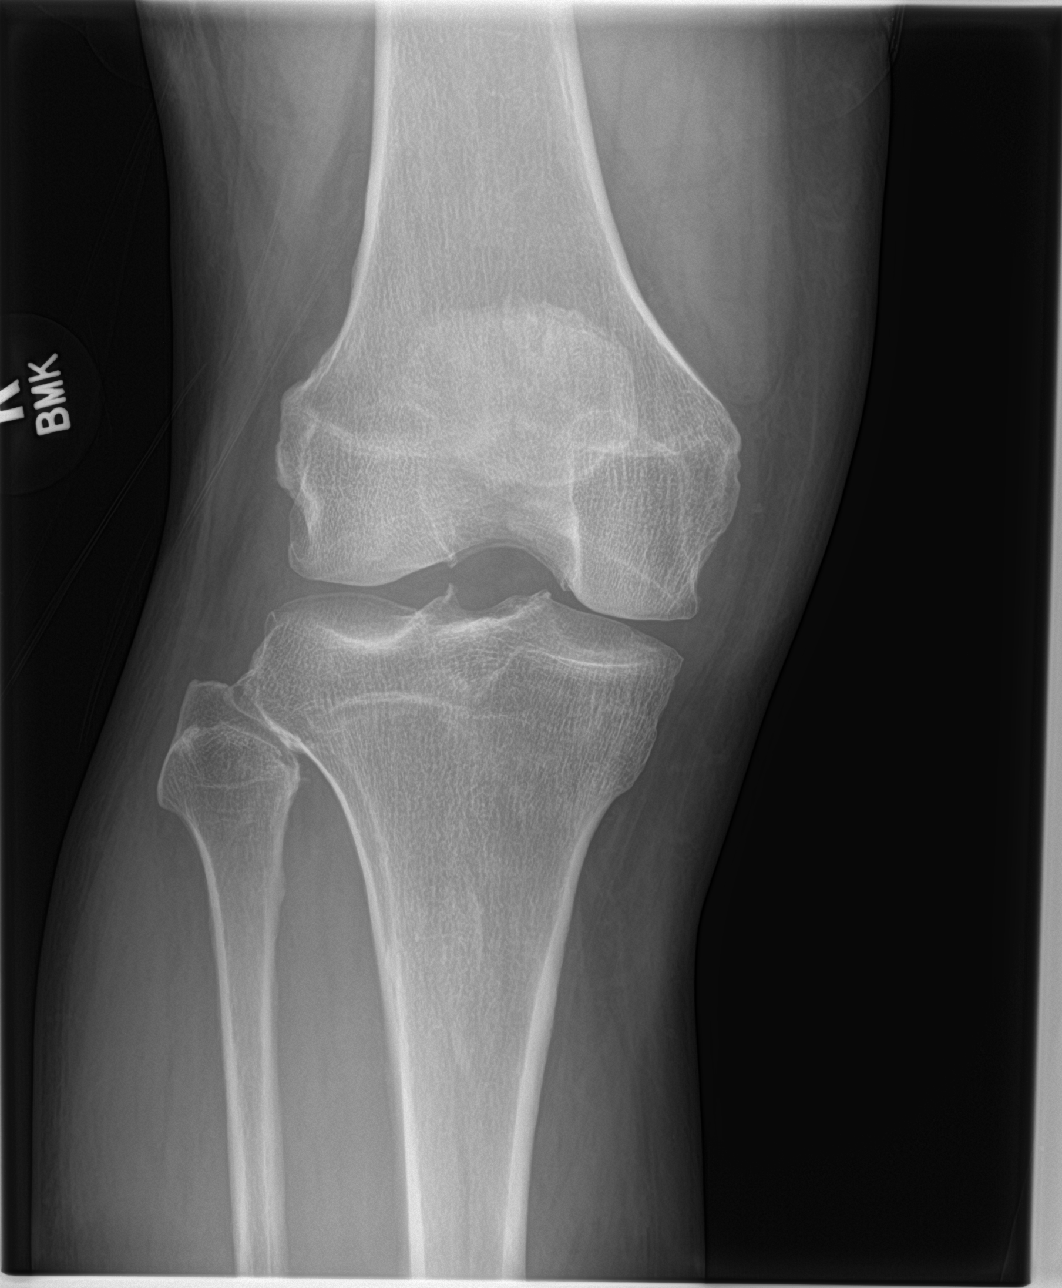
[im 3/4]
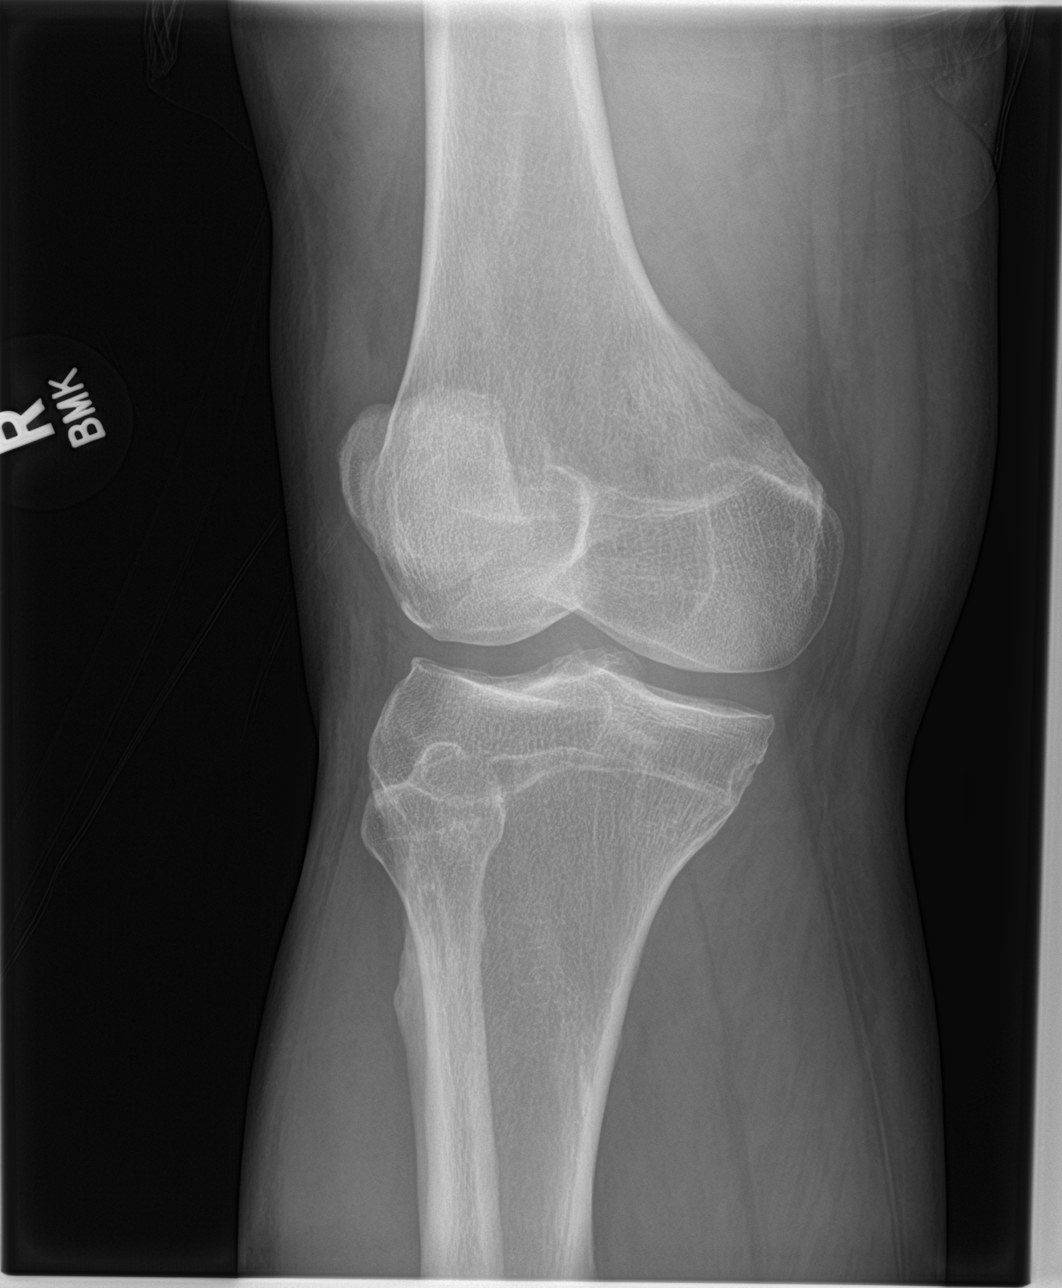
[im 4/4]
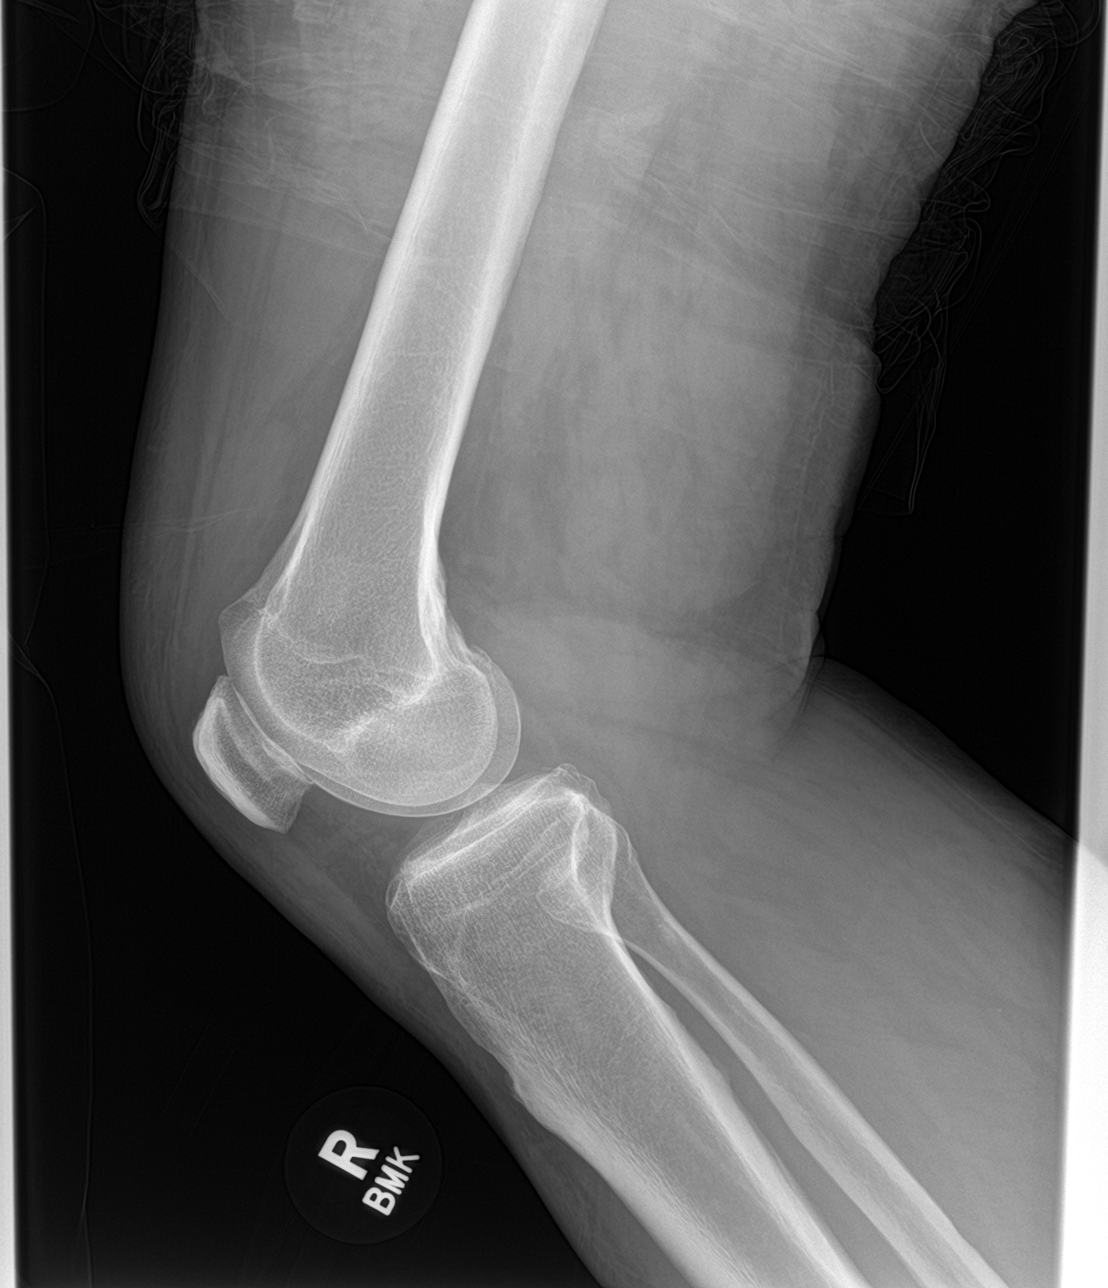

[4 of 4 positions shown; findings below may reference images not displayed]

FINDINGS: Old appearing deformity of the right lateral tibial plateau with
growth arrest line suggesting old injury. No evidence of acute
fracture or dislocation. The bone cortex appears intact. No focal
bone lesion or bone destruction. Joint spaces are normal. No
significant effusions. Soft tissues are unremarkable.
IMPRESSION: Probable old deformity of the lateral tibial plateau. No acute
displaced fractures identified.

## 2022-02-13 MED ORDER — LIDOCAINE 5 % EX PTCH: 1.0000 | MEDICATED_PATCH | CUTANEOUS | 1 refills | Status: DC

## 2022-02-13 MED ORDER — FEXOFENADINE HCL 180 MG PO TABS: 180.0000 mg | ORAL_TABLET | Freq: Every day | ORAL | 1 refills | Status: DC

## 2022-02-13 MED ORDER — RYBELSUS 7 MG PO TABS: 7.0000 mg | ORAL_TABLET | Freq: Every day | ORAL | 1 refills | Status: DC

## 2022-02-13 NOTE — Progress Notes (Signed)
? ?BP 123/69   Pulse 72   Temp 98.5 ?F (36.9 ?C) (Oral)   Ht 5' 6.93" (1.7 m)   Wt 196 lb (88.9 kg)   SpO2 98%   BMI 30.76 kg/m?   ? ?Subjective:  ? ? Patient ID: Devin Arias, male    DOB: 1961/10/17, 61 y.o.   MRN: [id number] ? ?Chief Complaint  ?Patient presents with  ?? Hypertension  ?? Diabetes  ?? Hyperlipidemia  ?? elevated HLT  ?? Itchy spot  ?  Center of back, for past month  ? ? ?HPI: ?ALTIN SEASE is a 61 y.o. male ? ?Hypertension ? ?Diabetes ? ?Hyperlipidemia ? ? ?Chief Complaint  ?Patient presents with  ?? Hypertension  ?? Diabetes  ?? Hyperlipidemia  ?? elevated HLT  ?? Itchy spot  ?  Center of back, for past month  ? ? ?Relevant past medical, surgical, family and social history reviewed and updated as indicated. Interim medical history since our last visit reviewed. ?Allergies and medications reviewed and updated. ? ?Review of Systems ? ?Per HPI unless specifically indicated above ? ?   ?Objective:  ?  ?BP 123/69   Pulse 72   Temp 98.5 ?F (36.9 ?C) (Oral)   Ht 5' 6.93" (1.7 m)   Wt 196 lb (88.9 kg)   SpO2 98%   BMI 30.76 kg/m?   ?Wt Readings from Last 3 Encounters:  ?02/13/22 196 lb (88.9 kg)  ?12/26/21 196 lb 3.2 oz (89 kg)  ?11/30/21 200 lb (90.7 kg)  ?  ?Physical Exam ?Vitals and nursing note reviewed.  ?Constitutional:   ?   General: He is not in acute distress. ?   Appearance: Normal appearance. He is not ill-appearing or diaphoretic.  ?HENT:  ?   Head: Normocephalic and atraumatic.  ?   Right Ear: Tympanic membrane and external ear normal. There is no impacted cerumen.  ?   Left Ear: External ear normal.  ?   Nose: No congestion or rhinorrhea.  ?   Mouth/Throat:  ?   Pharynx: No oropharyngeal exudate or posterior oropharyngeal erythema.  ?Eyes:  ?   Conjunctiva/sclera: Conjunctivae normal.  ?   Pupils: Pupils are equal, round, and reactive to light.  ?Cardiovascular:  ?   Rate and Rhythm: Normal rate and regular rhythm.  ?   Heart sounds:  ?  No gallop.  ?Pulmonary:  ?    Effort: No respiratory distress.  ?   Breath sounds: No stridor. No wheezing or rhonchi.  ?Chest:  ?   Chest wall: No tenderness.  ?Musculoskeletal:  ?   Cervical back: Normal range of motion and neck supple. No rigidity or tenderness.  ?   Left lower leg: No edema.  ?Neurological:  ?   Mental Status: He is alert.  ? ? ?Results for orders placed or performed in visit on 02/13/22  ?Bayer DCA Hb A1c Waived (STAT)  ?Result Value Ref Range  ? HB A1C (BAYER DCA - WAIVED) 8.3 (H) 4.8 - 5.6 %  ? ?   ? ? ?Current Outpatient Medications:  ??  fexofenadine (ALLEGRA ALLERGY) 180 MG tablet, Take 1 tablet (180 mg total) by mouth daily., Disp: 10 tablet, Rfl: 1 ??  Semaglutide (RYBELSUS) 7 MG TABS, Take 7 mg by mouth daily., Disp: 30 tablet, Rfl: 1 ??  amLODipine (NORVASC) 5 MG tablet, Take 1 tablet (5 mg total) by mouth daily., Disp: 90 tablet, Rfl: 3 ??  atorvastatin (LIPITOR) 40 MG tablet, Take 1 tablet (40  mg total) by mouth daily., Disp: 30 tablet, Rfl: 2 ??  cyclobenzaprine (FLEXERIL) 10 MG tablet, Take 1 tablet by mouth 3 (three) times daily as needed., Disp: , Rfl:  ??  empagliflozin (JARDIANCE) 25 MG TABS tablet, Take 1 tablet (25 mg total) by mouth daily before breakfast., Disp: 90 tablet, Rfl: 1 ??  ibuprofen (ADVIL) 600 MG tablet, Take 600 mg by mouth 2 (two) times daily as needed., Disp: , Rfl:  ??  lidocaine (LIDODERM) 5 %, Place 1 patch onto the skin daily. Remove & Discard patch within 12 hours or as directed by MD, Disp: 90 patch, Rfl: 1 ??  losartan-hydrochlorothiazide (HYZAAR) 100-25 MG tablet, Take 1 tablet by mouth daily., Disp: 90 tablet, Rfl: 0 ??  meloxicam (MOBIC) 15 MG tablet, Take 1 tablet (15 mg total) by mouth daily., Disp: 30 tablet, Rfl: 0 ??  metFORMIN (GLUCOPHAGE) 1000 MG tablet, Take 1 tablet (1,000 mg total) by mouth 2 (two) times daily with a meal., Disp: 180 tablet, Rfl: 1  ? ? ?Assessment & Plan:  ?Dm ?Is on rybelsus , jardiance and metformin  ?Increase rybelsus to 7 mg x 2 weeks then  increase to 14 mg. ?check HbA1c,  urine  microalbumin  diabetic diet plan given to pt  adviced regarding hypoglycemia and instructions given to pt today on how to prevent and treat the same if it were to occur. pt acknowledges the plan and voices understanding of the same.  exercise plan given and encouraged.   advice diabetic yearly podiatry, ophthalmology , nutritionist , dental check q 6 months. ? ?2. HLD  ?TG 158  ?Is on lipitor 40 mg for such ?recheck FLP, check LFT's work on diet, SE of meds explained to pt. low fat and high fiber diet explained to pt. ? ?Problem List Items Addressed This Visit   ?None ?Visit Diagnoses   ? ? Type 2 diabetes mellitus without complication, without long-term current use of insulin ([place])    -  Primary  ? Relevant Medications  ? Semaglutide (RYBELSUS) 7 MG TABS  ? Other Relevant Orders  ? Bayer DCA Hb A1c Waived  ? Bayer DCA Hb A1c Waived (STAT) (Completed)  ? Comprehensive metabolic panel  ? Bayer DCA Hb A1c Waived (STAT)  ? ?  ?  ? ?Orders Placed This Encounter  ?Procedures  ?? Bayer DCA Hb A1c Waived  ?? Bayer DCA Hb A1c Waived (STAT)  ?? Comprehensive metabolic panel  ?? Bayer DCA Hb A1c Waived (STAT)  ?  ? ?Meds ordered this encounter  ?Medications  ?? lidocaine (LIDODERM) 5 %  ?  Sig: Place 1 patch onto the skin daily. Remove & Discard patch within 12 hours or as directed by MD  ?  Dispense:  90 patch  ?  Refill:  1  ?? Semaglutide (RYBELSUS) 7 MG TABS  ?  Sig: Take 7 mg by mouth daily.  ?  Dispense:  30 tablet  ?  Refill:  1  ?? fexofenadine (ALLEGRA ALLERGY) 180 MG tablet  ?  Sig: Take 1 tablet (180 mg total) by mouth daily.  ?  Dispense:  10 tablet  ?  Refill:  1  ?  ? ?Follow up plan: ?Return in about 6 weeks (around 03/27/2022). ? ? ?

## 2022-03-08 DIAGNOSIS — G4733 Obstructive sleep apnea (adult) (pediatric): Secondary | ICD-10-CM | POA: Diagnosis not present

## 2022-03-08 DIAGNOSIS — I10 Essential (primary) hypertension: Secondary | ICD-10-CM | POA: Diagnosis not present

## 2022-03-15 ENCOUNTER — Other Ambulatory Visit: Payer: Self-pay | Admitting: Internal Medicine

## 2022-03-15 NOTE — Telephone Encounter (Signed)
Requested medication (s) are due for refill today: yes ? ?Requested medication (s) are on the active medication list: yes ? ?Last refill:  02/13/22 ? ?Future visit scheduled: yes ? ?Notes to clinic:  Unable to refill per protocol, no protocol attached, review manually. ? ? ?  ?Requested Prescriptions  ?Pending Prescriptions Disp Refills  ? RYBELSUS 7 MG TABS [Pharmacy Med Name: RYBELSUS 7 MG TAB[*^]] 30 tablet 1  ?  Sig: TAKE ONE TABLET BY MOUTH ONE TIME DAILY  ?  ? Off-Protocol Failed - 03/15/2022 12:31 PM  ?  ?  Failed - Medication not assigned to a protocol, review manually.  ?  ?  Passed - Valid encounter within last 12 months  ?  Recent Outpatient Visits   ? ?      ? 1 month ago Type 2 diabetes mellitus without complication, without long-term current use of insulin ([place])  ? [facility] Vigg, Avanti, MD  ? 2 months ago Diabetes mellitus without complication ([place])  ? [facility] Vigg, Avanti, MD  ? 5 months ago Type 2 diabetes mellitus without complication, without long-term current use of insulin ([place])  ? [facility] Vigg, Avanti, MD  ? 8 months ago Sleep apnea, unspecified type  ? [facility] Vigg, Avanti, MD  ? 10 months ago Diabetes mellitus without complication ([place])  ? [facility] Vigg, Avanti, MD  ? ?  ?  ?Future Appointments   ? ?        ? In 1 week Vigg, Avanti, MD [facility]  ? ?  ? ?  ?  ?  ? ? ?

## 2022-03-20 ENCOUNTER — Other Ambulatory Visit (INDEPENDENT_AMBULATORY_CARE_PROVIDER_SITE_OTHER): Payer: Medicaid Other

## 2022-03-20 DIAGNOSIS — E119 Type 2 diabetes mellitus without complications: Secondary | ICD-10-CM

## 2022-03-20 LAB — BAYER DCA HB A1C WAIVED: HB A1C (BAYER DCA - WAIVED): 9 % — ABNORMAL HIGH (ref 4.8–5.6)

## 2022-03-21 LAB — COMPREHENSIVE METABOLIC PANEL
ALT: 26 IU/L (ref 0–44)
AST: 20 IU/L (ref 0–40)
Albumin/Globulin Ratio: 1.7 (ref 1.2–2.2)
Albumin: 4.6 g/dL (ref 3.8–4.9)
Alkaline Phosphatase: 139 IU/L — ABNORMAL HIGH (ref 44–121)
BUN/Creatinine Ratio: 18 (ref 10–24)
BUN: 14 mg/dL (ref 8–27)
Bilirubin Total: 0.5 mg/dL (ref 0.0–1.2)
CO2: 24 mmol/L (ref 20–29)
Calcium: 9.5 mg/dL (ref 8.6–10.2)
Chloride: 101 mmol/L (ref 96–106)
Creatinine, Ser: 0.79 mg/dL (ref 0.76–1.27)
Globulin, Total: 2.7 g/dL (ref 1.5–4.5)
Glucose: 220 mg/dL — ABNORMAL HIGH (ref 70–99)
Potassium: 4 mmol/L (ref 3.5–5.2)
Sodium: 141 mmol/L (ref 134–144)
Total Protein: 7.3 g/dL (ref 6.0–8.5)
eGFR: 102 mL/min/{1.73_m2} (ref >59)

## 2022-03-22 ENCOUNTER — Other Ambulatory Visit: Payer: Self-pay | Admitting: Internal Medicine

## 2022-03-22 NOTE — Telephone Encounter (Signed)
Requested Prescriptions  ?Pending Prescriptions Disp Refills  ?? atorvastatin (LIPITOR) 40 MG tablet [Pharmacy Med Name: ATORVASTATIN 40 MG TAB[*]] 30 tablet 2  ?  Sig: TAKE ONE TABLET BY MOUTH ONE TIME DAILY  ?  ? Cardiovascular:  Antilipid - Statins Failed - 03/22/2022  2:00 AM  ?  ?  Failed - Lipid Panel in normal range within the last 12 months  ?  Cholesterol, Total  ?Date Value Ref Range Status  ?02/06/2022 119 100 - 199 mg/dL Final  ? ?LDL Chol Calc (NIH)  ?Date Value Ref Range Status  ?02/06/2022 48 0 - 99 mg/dL Final  ? ?HDL  ?Date Value Ref Range Status  ?02/06/2022 45 >39 mg/dL Final  ? ?Triglycerides  ?Date Value Ref Range Status  ?02/06/2022 153 (H) 0 - 149 mg/dL Final  ? ?  ?  ?  Passed - Patient is not pregnant  ?  ?  Passed - Valid encounter within last 12 months  ?  Recent Outpatient Visits   ?      ? 1 month ago Type 2 diabetes mellitus without complication, without long-term current use of insulin ([place])  ? [facility] Vigg, Avanti, MD  ? 2 months ago Diabetes mellitus without complication ([place])  ? [facility] Vigg, Avanti, MD  ? 5 months ago Type 2 diabetes mellitus without complication, without long-term current use of insulin ([place])  ? [facility] Vigg, Avanti, MD  ? 8 months ago Sleep apnea, unspecified type  ? [facility] Vigg, Avanti, MD  ? 10 months ago Diabetes mellitus without complication ([place])  ? [facility] Vigg, Avanti, MD  ?  ?  ?Future Appointments   ?        ? In 5 days Vigg, Avanti, MD [facility]  ?  ? ?  ?  ?  ? ? ?

## 2022-03-23 ENCOUNTER — Other Ambulatory Visit: Payer: Self-pay | Admitting: Internal Medicine

## 2022-03-25 NOTE — Telephone Encounter (Signed)
Refilled 12/26/2021 #90 3 refills. ?Requested Prescriptions  ?Pending Prescriptions Disp Refills  ?? amLODipine (NORVASC) 5 MG tablet [Pharmacy Med Name: AMLODIPINE BESYLATE 5 MG TAB] 90 tablet 3  ?  Sig: TAKE 1 TABLET (5 MG TOTAL) BY MOUTH DAILY.  ?  ? Cardiovascular: Calcium Channel Blockers 2 Passed - 03/23/2022  9:54 AM  ?  ?  Passed - Last BP in normal range  ?  BP Readings from Last 1 Encounters:  ?02/13/22 123/69  ?   ?  ?  Passed - Last Heart Rate in normal range  ?  Pulse Readings from Last 1 Encounters:  ?02/13/22 72  ?   ?  ?  Passed - Valid encounter within last 6 months  ?  Recent Outpatient Visits   ?      ? 1 month ago Type 2 diabetes mellitus without complication, without long-term current use of insulin ([place])  ? [facility] Vigg, Avanti, MD  ? 2 months ago Diabetes mellitus without complication ([place])  ? [facility] Vigg, Avanti, MD  ? 5 months ago Type 2 diabetes mellitus without complication, without long-term current use of insulin ([place])  ? [facility] Vigg, Avanti, MD  ? 9 months ago Sleep apnea, unspecified type  ? [facility] Vigg, Avanti, MD  ? 10 months ago Diabetes mellitus without complication ([place])  ? [facility] Vigg, Avanti, MD  ?  ?  ?Future Appointments   ?        ? In 2 days Vigg, Avanti, MD [facility]  ?  ? ?  ?  ?  ? ?

## 2022-03-27 ENCOUNTER — Ambulatory Visit (INDEPENDENT_AMBULATORY_CARE_PROVIDER_SITE_OTHER): Payer: Medicaid Other | Admitting: Internal Medicine

## 2022-03-27 ENCOUNTER — Encounter: Payer: Self-pay | Admitting: Internal Medicine

## 2022-03-27 VITALS — BP 117/73 | HR 56 | Temp 97.7°F | Ht 66.93 in | Wt 186.8 lb

## 2022-03-27 DIAGNOSIS — E785 Hyperlipidemia, unspecified: Secondary | ICD-10-CM | POA: Diagnosis not present

## 2022-03-27 DIAGNOSIS — E119 Type 2 diabetes mellitus without complications: Secondary | ICD-10-CM | POA: Diagnosis not present

## 2022-03-27 MED ORDER — RYBELSUS 14 MG PO TABS: 14.0000 mg | ORAL_TABLET | Freq: Every day | ORAL | 3 refills | Status: DC

## 2022-03-27 NOTE — Progress Notes (Signed)
? ?BP 117/73   Pulse (!) 56   Temp 97.7 ?F (36.5 ?C) (Oral)   Ht 5' 6.93" (1.7 m)   Wt 186 lb 12.8 oz (84.7 kg)   SpO2 98%   BMI 29.32 kg/m?   ? ?Subjective:  ? ? Patient ID: Devin Arias, male    DOB: [birth year], 61 y.o.   MRN: [id number] ? ?Chief Complaint  ?Patient presents with  ?? Diabetes  ? ? ?HPI: ?Devin Arias is a 61 y.o. male ? ?Diabetes ?He presents for his follow-up diabetic visit. He has type 2 diabetes mellitus. His disease course has been fluctuating. Pertinent negatives for hypoglycemia include no confusion, dizziness, headaches, speech difficulty or tremors. Pertinent negatives for diabetes include no blurred vision, no chest pain, no fatigue, no foot paresthesias, no foot ulcerations, no polydipsia, no polyphagia, no polyuria, no visual change, no weakness and no weight loss. Symptoms are worsening.  ?Hyperlipidemia ?This is a chronic problem. The problem is controlled. Pertinent negatives include no chest pain, focal weakness, leg pain, myalgias or shortness of breath.  ? ?Chief Complaint  ?Patient presents with  ?? Diabetes  ? ? ?Relevant past medical, surgical, family and social history reviewed and updated as indicated. Interim medical history since our last visit reviewed. ?Allergies and medications reviewed and updated. ? ?Review of Systems  ?Constitutional:  Negative for activity change, appetite change, chills, fatigue, fever and weight loss.  ?HENT:  Negative for congestion, ear discharge, ear pain and facial swelling.   ?Eyes:  Negative for blurred vision, pain, discharge and itching.  ?Respiratory:  Negative for cough, chest tightness, shortness of breath and wheezing.   ?Cardiovascular:  Negative for chest pain, palpitations and leg swelling.  ?Gastrointestinal:  Negative for abdominal distention, abdominal pain, blood in stool, constipation, diarrhea, nausea and vomiting.  ?Endocrine: Negative for cold intolerance, heat intolerance, polydipsia, polyphagia and  polyuria.  ?Genitourinary:  Negative for difficulty urinating, dysuria, flank pain, frequency, hematuria and urgency.  ?Musculoskeletal:  Negative for arthralgias, gait problem, joint swelling and myalgias.  ?Skin:  Negative for color change, rash and wound.  ?Neurological:  Negative for dizziness, tremors, focal weakness, speech difficulty, weakness, light-headedness, numbness and headaches.  ?Hematological:  Does not bruise/bleed easily.  ?Psychiatric/Behavioral:  Negative for agitation, confusion, decreased concentration, sleep disturbance and suicidal ideas.   ? ?Per HPI unless specifically indicated above ? ?   ?Objective:  ?  ?BP 117/73   Pulse (!) 56   Temp 97.7 ?F (36.5 ?C) (Oral)   Ht 5' 6.93" (1.7 m)   Wt 186 lb 12.8 oz (84.7 kg)   SpO2 98%   BMI 29.32 kg/m?   ?Wt Readings from Last 3 Encounters:  ?03/27/22 186 lb 12.8 oz (84.7 kg)  ?02/13/22 196 lb (88.9 kg)  ?12/26/21 196 lb 3.2 oz (89 kg)  ?  ?Physical Exam ?Vitals and nursing note reviewed.  ?Constitutional:   ?   General: He is not in acute distress. ?   Appearance: Normal appearance. He is not ill-appearing or diaphoretic.  ?HENT:  ?   Head: Normocephalic and atraumatic.  ?   Right Ear: Tympanic membrane and external ear normal. There is no impacted cerumen.  ?   Left Ear: External ear normal.  ?   Nose: No congestion or rhinorrhea.  ?   Mouth/Throat:  ?   Pharynx: No oropharyngeal exudate or posterior oropharyngeal erythema.  ?Eyes:  ?   Conjunctiva/sclera: Conjunctivae normal.  ?   Pupils: Pupils are equal, round,  and reactive to light.  ?Cardiovascular:  ?   Rate and Rhythm: Normal rate and regular rhythm.  ?   Heart sounds: No murmur heard. ?  No friction rub. No gallop.  ?Pulmonary:  ?   Effort: No respiratory distress.  ?   Breath sounds: No stridor. No wheezing or rhonchi.  ?Chest:  ?   Chest wall: No tenderness.  ?Abdominal:  ?   General: Abdomen is flat. Bowel sounds are normal.  ?   Palpations: Abdomen is soft. There is no mass.  ?    Tenderness: There is no abdominal tenderness.  ?Musculoskeletal:  ?   Cervical back: Normal range of motion and neck supple. No rigidity or tenderness.  ?   Left lower leg: No edema.  ?Skin: ?   General: Skin is warm and dry.  ?Neurological:  ?   Mental Status: He is alert.  ? ? ?Results for orders placed or performed in visit on 03/20/22  ?Bayer DCA Hb A1c Waived (STAT)  ?Result Value Ref Range  ? HB A1C (BAYER DCA - WAIVED) 9.0 (H) 4.8 - 5.6 %  ?Comprehensive metabolic panel  ?Result Value Ref Range  ? Glucose 220 (H) 70 - 99 mg/dL  ? BUN 14 8 - 27 mg/dL  ? Creatinine, Ser 0.79 0.76 - 1.27 mg/dL  ? eGFR 102 >59 mL/min/1.73  ? BUN/Creatinine Ratio 18 10 - 24  ? Sodium 141 134 - 144 mmol/L  ? Potassium 4.0 3.5 - 5.2 mmol/L  ? Chloride 101 96 - 106 mmol/L  ? CO2 24 20 - 29 mmol/L  ? Calcium 9.5 8.6 - 10.2 mg/dL  ? Total Protein 7.3 6.0 - 8.5 g/dL  ? Albumin 4.6 3.8 - 4.9 g/dL  ? Globulin, Total 2.7 1.5 - 4.5 g/dL  ? Albumin/Globulin Ratio 1.7 1.2 - 2.2  ? Bilirubin Total 0.5 0.0 - 1.2 mg/dL  ? Alkaline Phosphatase 139 (H) 44 - 121 IU/L  ? AST 20 0 - 40 IU/L  ? ALT 26 0 - 44 IU/L  ? ?   ? ? ?Current Outpatient Medications:  ??  Semaglutide (RYBELSUS) 14 MG TABS, Take 1 tablet (14 mg total) by mouth daily., Disp: 30 tablet, Rfl: 3 ??  amLODipine (NORVASC) 5 MG tablet, Take 1 tablet (5 mg total) by mouth daily., Disp: 90 tablet, Rfl: 3 ??  atorvastatin (LIPITOR) 40 MG tablet, TAKE ONE TABLET BY MOUTH ONE TIME DAILY, Disp: 30 tablet, Rfl: 2 ??  cyclobenzaprine (FLEXERIL) 10 MG tablet, Take 1 tablet by mouth 3 (three) times daily as needed., Disp: , Rfl:  ??  empagliflozin (JARDIANCE) 25 MG TABS tablet, Take 1 tablet (25 mg total) by mouth daily before breakfast., Disp: 90 tablet, Rfl: 1 ??  fexofenadine (ALLEGRA ALLERGY) 180 MG tablet, Take 1 tablet (180 mg total) by mouth daily., Disp: 10 tablet, Rfl: 1 ??  ibuprofen (ADVIL) 600 MG tablet, Take 600 mg by mouth 2 (two) times daily as needed., Disp: , Rfl:  ??  lidocaine  (LIDODERM) 5 %, Place 1 patch onto the skin daily. Remove & Discard patch within 12 hours or as directed by MD, Disp: 90 patch, Rfl: 1 ??  losartan-hydrochlorothiazide (HYZAAR) 100-25 MG tablet, Take 1 tablet by mouth daily., Disp: 90 tablet, Rfl: 0 ??  meloxicam (MOBIC) 15 MG tablet, Take 1 tablet (15 mg total) by mouth daily., Disp: 30 tablet, Rfl: 0 ??  metFORMIN (GLUCOPHAGE) 1000 MG tablet, Take 1 tablet (1,000 mg total) by mouth 2 (two)  times daily with a meal., Disp: 180 tablet, Rfl: 1  ? ? ?Assessment & Plan:  ? ? DM : is on rybelsus 14 mg and metformin 1000 mg bid as well as jardiance 25 mg.  ?check HbA1c,  urine  microalbumin  diabetic diet plan given to pt  adviced regarding hypoglycemia and instructions given to pt today on how to prevent and treat the same if it were to occur. pt acknowledges the plan and voices understanding of the same.  exercise plan given and encouraged.   advice diabetic yearly podiatry, ophthalmology , nutritionist , dental check q 6 months, ? ? Latest Reference Range & Units 12/26/21 10:44 02/13/22 10:19 03/20/22 08:30  ?HB A1C (BAYER DCA - WAIVED) 4.8 - 5.6 % 6.4 (H) 8.3 (H) 9.0 (H)  ?(H): Data is abnormally high ? ?Body mass index is 29.32 kg/m?. ?Obesity : Lifestyle modifications advised to pt.  ? Portion control and avoiding high carb low fat diet advised.  Diet plan given to pt   exercise plan given and encouraged.  To increase exercise to 150 mins a week ie 21/2 hours a week. Pt verbalises understanding of the above.  ?  ?HLD: recheck FLP, check LFT's work on diet, SE of meds explained to pt. low fat and high fiber diet explained to pt. ? ? Latest Reference Range & Units 05/08/21 08:08 06/19/21 08:07 10/12/21 10:05 02/06/22 08:29  ?Total CHOL/HDL Ratio 0.0 - 5.0 ratio 3.0 2.8 2.5 2.6  ?Cholesterol, Total 100 - 199 mg/dL 108 97 (L) 104 119  ?HDL Cholesterol >39 mg/dL 36 (L) 35 (L) 42 45  ?Triglycerides 0 - 149 mg/dL 177 (H) 148 82 153 (H)  ?VLDL Cholesterol Cal 5 - 40 mg/dL  29 25 17 26   ?LDL Chol Calc (NIH) 0 - 99 mg/dL 43 37 45 48  ?(L): Data is abnormally low ?(H): Data is abnormally high ? ? ?Problem List Items Addressed This Visit   ? ?  ? Endocrine  ? Type 2 diabetes mellitus withou

## 2022-04-07 DIAGNOSIS — I10 Essential (primary) hypertension: Secondary | ICD-10-CM | POA: Diagnosis not present

## 2022-04-07 DIAGNOSIS — G4733 Obstructive sleep apnea (adult) (pediatric): Secondary | ICD-10-CM | POA: Diagnosis not present

## 2022-04-13 ENCOUNTER — Other Ambulatory Visit: Payer: Self-pay | Admitting: Internal Medicine

## 2022-04-15 NOTE — Telephone Encounter (Signed)
rx dose was increased on 03/27/22. ?Requested Prescriptions  ?Pending Prescriptions Disp Refills  ?? RYBELSUS 7 MG TABS [Pharmacy Med Name: RYBELSUS 7 MG TAB[*^]] 30 tablet 1  ?  Sig: TAKE ONE TABLET BY MOUTH ONE TIME DAILY  ?  ? Off-Protocol Failed - 04/13/2022  2:02 AM  ?  ?  Failed - Medication not assigned to a protocol, review manually.  ?  ?  Passed - Valid encounter within last 12 months  ?  Recent Outpatient Visits   ?      ? 2 weeks ago Type 2 diabetes mellitus without complication, without long-term current use of insulin ([place])  ? [facility] Vigg, Avanti, MD  ? 2 months ago Type 2 diabetes mellitus without complication, without long-term current use of insulin ([place])  ? [facility] Vigg, Avanti, MD  ? 3 months ago Diabetes mellitus without complication ([place])  ? [facility] Vigg, Avanti, MD  ? 6 months ago Type 2 diabetes mellitus without complication, without long-term current use of insulin ([place])  ? [facility] Vigg, Avanti, MD  ? 9 months ago Sleep apnea, unspecified type  ? [facility] Vigg, Avanti, MD  ?  ?  ?Future Appointments   ?        ? In 1 month Vigg, Avanti, MD [facility]  ?  ? ?  ?  ?  ? ? ?

## 2022-05-08 DIAGNOSIS — I10 Essential (primary) hypertension: Secondary | ICD-10-CM | POA: Diagnosis not present

## 2022-05-08 DIAGNOSIS — G4733 Obstructive sleep apnea (adult) (pediatric): Secondary | ICD-10-CM | POA: Diagnosis not present

## 2022-05-15 ENCOUNTER — Other Ambulatory Visit: Payer: Self-pay

## 2022-05-15 ENCOUNTER — Encounter: Payer: Self-pay | Admitting: Internal Medicine

## 2022-05-15 ENCOUNTER — Ambulatory Visit (INDEPENDENT_AMBULATORY_CARE_PROVIDER_SITE_OTHER): Payer: Medicaid Other | Admitting: Internal Medicine

## 2022-05-15 VITALS — BP 114/77 | HR 74 | Temp 97.8°F | Ht 66.93 in | Wt 184.8 lb

## 2022-05-15 DIAGNOSIS — E119 Type 2 diabetes mellitus without complications: Secondary | ICD-10-CM | POA: Diagnosis not present

## 2022-05-15 DIAGNOSIS — E785 Hyperlipidemia, unspecified: Secondary | ICD-10-CM

## 2022-05-15 LAB — URINALYSIS, ROUTINE W REFLEX MICROSCOPIC
Bilirubin, UA: NEGATIVE
Ketones, UA: NEGATIVE
Leukocytes,UA: NEGATIVE
Nitrite, UA: NEGATIVE
Protein,UA: NEGATIVE
RBC, UA: NEGATIVE
Specific Gravity, UA: 1.02 (ref 1.005–1.030)
Urobilinogen, Ur: 0.2 mg/dL (ref 0.2–1.0)
pH, UA: 5.5 (ref 5.0–7.5)

## 2022-05-15 LAB — BAYER DCA HB A1C WAIVED: HB A1C (BAYER DCA - WAIVED): 8.6 % — ABNORMAL HIGH (ref 4.8–5.6)

## 2022-05-15 LAB — MICROALBUMIN, URINE WAIVED
Creatinine, Urine Waived: 50 mg/dL (ref 10–300)
Microalb, Ur Waived: 30 mg/L — ABNORMAL HIGH (ref 0–19)
Microalb/Creat Ratio: <30 mg/g (ref <30)

## 2022-05-15 NOTE — Progress Notes (Signed)
BP 114/77   Pulse 74   Temp 97.8 F (36.6 C) (Oral)   Ht 5' 6.93" (1.7 m)   Wt 184 lb 12.8 oz (83.8 kg)   SpO2 97%   BMI 29.01 kg/m    Subjective:    Patient ID: Devin Arias, male    DOB: [birth year], 61 y.o.   MRN: [id number]  Chief Complaint  Patient presents with  . Hypertension  . Hyperlipidemia    HPI: Devin Arias is a 61 y.o. male  Hypertension This is a chronic problem. The problem is controlled. Pertinent negatives include no anxiety, blurred vision, chest pain, malaise/fatigue, neck pain, orthopnea, peripheral edema, PND or shortness of breath. There is no history of chronic renal disease.  Hyperlipidemia This is a chronic problem. The problem is controlled. He has no history of chronic renal disease. Pertinent negatives include no chest pain or shortness of breath.  Diabetes He presents for his follow-up (fsbs 180 's) diabetic visit. He has type 2 (hasnt been compliant with diet) diabetes mellitus. His disease course has been fluctuating. Pertinent negatives for diabetes include no blurred vision, no chest pain, no fatigue, no foot paresthesias, no foot ulcerations, no polydipsia, no polyphagia, no polyuria, no visual change, no weakness and no weight loss.    Chief Complaint  Patient presents with  . Hypertension  . Hyperlipidemia    Relevant past medical, surgical, family and social history reviewed and updated as indicated. Interim medical history since our last visit reviewed. Allergies and medications reviewed and updated.  Review of Systems  Constitutional:  Negative for fatigue, malaise/fatigue and weight loss.  Eyes:  Negative for blurred vision.  Respiratory:  Negative for shortness of breath.   Cardiovascular:  Negative for chest pain, orthopnea and PND.  Endocrine: Negative for polydipsia, polyphagia and polyuria.  Musculoskeletal:  Negative for neck pain.  Neurological:  Negative for weakness.    Per HPI unless specifically  indicated above     Objective:    BP 114/77   Pulse 74   Temp 97.8 F (36.6 C) (Oral)   Ht 5' 6.93" (1.7 m)   Wt 184 lb 12.8 oz (83.8 kg)   SpO2 97%   BMI 29.01 kg/m   Wt Readings from Last 3 Encounters:  05/15/22 184 lb 12.8 oz (83.8 kg)  03/27/22 186 lb 12.8 oz (84.7 kg)  02/13/22 196 lb (88.9 kg)    Physical Exam Vitals and nursing note reviewed.  Constitutional:      General: He is not in acute distress.    Appearance: Normal appearance. He is not ill-appearing or diaphoretic.  HENT:     Head: Normocephalic and atraumatic.     Right Ear: Tympanic membrane and external ear normal. There is no impacted cerumen.     Left Ear: External ear normal.     Nose: No congestion or rhinorrhea.     Mouth/Throat:     Pharynx: No oropharyngeal exudate or posterior oropharyngeal erythema.  Eyes:     Conjunctiva/sclera: Conjunctivae normal.     Pupils: Pupils are equal, round, and reactive to light.  Cardiovascular:     Rate and Rhythm: Normal rate and regular rhythm.     Heart sounds: No murmur heard.    No friction rub. No gallop.  Pulmonary:     Effort: No respiratory distress.     Breath sounds: No stridor. No wheezing or rhonchi.  Chest:     Chest wall: No tenderness.  Abdominal:  General: Abdomen is flat.     Palpations: Abdomen is soft.  Musculoskeletal:     Cervical back: Normal range of motion and neck supple. No rigidity or tenderness.     Left lower leg: No edema.  Skin:    General: Skin is warm and dry.  Neurological:     Mental Status: He is alert.    Results for orders placed or performed in visit on 03/20/22  Bayer DCA Hb A1c Waived (STAT)  Result Value Ref Range   HB A1C (BAYER DCA - WAIVED) 9.0 (H) 4.8 - 5.6 %  Comprehensive metabolic panel  Result Value Ref Range   Glucose 220 (H) 70 - 99 mg/dL   BUN 14 8 - 27 mg/dL   Creatinine, Ser 0.79 0.76 - 1.27 mg/dL   eGFR 102 >59 mL/min/1.73   BUN/Creatinine Ratio 18 10 - 24   Sodium 141 134 - 144  mmol/L   Potassium 4.0 3.5 - 5.2 mmol/L   Chloride 101 96 - 106 mmol/L   CO2 24 20 - 29 mmol/L   Calcium 9.5 8.6 - 10.2 mg/dL   Total Protein 7.3 6.0 - 8.5 g/dL   Albumin 4.6 3.8 - 4.9 g/dL   Globulin, Total 2.7 1.5 - 4.5 g/dL   Albumin/Globulin Ratio 1.7 1.2 - 2.2   Bilirubin Total 0.5 0.0 - 1.2 mg/dL   Alkaline Phosphatase 139 (H) 44 - 121 IU/L   AST 20 0 - 40 IU/L   ALT 26 0 - 44 IU/L        Current Outpatient Medications:  .  amLODipine (NORVASC) 5 MG tablet, Take 1 tablet (5 mg total) by mouth daily., Disp: 90 tablet, Rfl: 3 .  atorvastatin (LIPITOR) 40 MG tablet, TAKE ONE TABLET BY MOUTH ONE TIME DAILY, Disp: 30 tablet, Rfl: 2 .  cyclobenzaprine (FLEXERIL) 10 MG tablet, Take 1 tablet by mouth 3 (three) times daily as needed., Disp: , Rfl:  .  empagliflozin (JARDIANCE) 25 MG TABS tablet, Take 1 tablet (25 mg total) by mouth daily before breakfast., Disp: 90 tablet, Rfl: 1 .  fexofenadine (ALLEGRA ALLERGY) 180 MG tablet, Take 1 tablet (180 mg total) by mouth daily., Disp: 10 tablet, Rfl: 1 .  ibuprofen (ADVIL) 600 MG tablet, Take 600 mg by mouth 2 (two) times daily as needed., Disp: , Rfl:  .  lidocaine (LIDODERM) 5 %, Place 1 patch onto the skin daily. Remove & Discard patch within 12 hours or as directed by MD, Disp: 90 patch, Rfl: 1 .  losartan-hydrochlorothiazide (HYZAAR) 100-25 MG tablet, Take 1 tablet by mouth daily., Disp: 90 tablet, Rfl: 0 .  meloxicam (MOBIC) 15 MG tablet, Take 1 tablet (15 mg total) by mouth daily., Disp: 30 tablet, Rfl: 0 .  metFORMIN (GLUCOPHAGE) 1000 MG tablet, Take 1 tablet (1,000 mg total) by mouth 2 (two) times daily with a meal., Disp: 180 tablet, Rfl: 1 .  Semaglutide (RYBELSUS) 14 MG TABS, Take 1 tablet (14 mg total) by mouth daily., Disp: 30 tablet, Rfl: 3    Assessment & Plan:  DM is on rybelsus 14 mg, metformin 1000 mg bid and jardiance. check HbA1c,  urine  microalbumin  diabetic diet plan given to pt  adviced regarding hypoglycemia and  instructions given to pt today on how to prevent and treat the same if it were to occur. pt acknowledges the plan and voices understanding of the same.  exercise plan given and encouraged.   advice diabetic yearly podiatry, ophthalmology , nutritionist ,  dental check q 6 months  2. HLD is on lipitor for such  recheck FLP, check LFT's work on diet, SE of meds explained to pt. low fat and high fiber diet explained to pt.  3. HTN :  Continue current meds.  Medication compliance emphasised. pt advised to keep Bp logs. Pt verbalised understanding of the same. Pt to have a low salt diet . Exercise to reach a goal of at least 150 mins a week.  lifestyle modifications explained and pt understands importance of the above. Under good control on current regimen. Continue current regimen. Continue to monitor. Call with any concerns. Refills given. Labs drawn today.   Problem List Items Addressed This Visit       Endocrine   Type 2 diabetes mellitus without complication, without long-term current use of insulin ([place]) - Primary   Relevant Orders   Bayer DCA Hb A1c Waived (Completed)   Urinalysis, Routine w reflex microscopic   Microalbumin, urine   Comprehensive metabolic panel   Lipid panel     Other   Hyperlipidemia   Relevant Orders   Bayer DCA Hb A1c Waived (Completed)   Urinalysis, Routine w reflex microscopic   Microalbumin, urine   Comprehensive metabolic panel   Lipid panel     Orders Placed This Encounter  Procedures  . Bayer DCA Hb A1c Waived  . Urinalysis, Routine w reflex microscopic  . Microalbumin, urine  . Comprehensive metabolic panel  . Lipid panel     No orders of the defined types were placed in this encounter.    Follow up plan: No follow-ups on file.

## 2022-05-16 LAB — COMPREHENSIVE METABOLIC PANEL
ALT: 25 IU/L (ref 0–44)
AST: 19 IU/L (ref 0–40)
Albumin/Globulin Ratio: 1.7 (ref 1.2–2.2)
Albumin: 4.6 g/dL (ref 3.8–4.9)
Alkaline Phosphatase: 138 IU/L — ABNORMAL HIGH (ref 44–121)
BUN/Creatinine Ratio: 14 (ref 10–24)
BUN: 11 mg/dL (ref 8–27)
Bilirubin Total: 0.4 mg/dL (ref 0.0–1.2)
CO2: 21 mmol/L (ref 20–29)
Calcium: 9.4 mg/dL (ref 8.6–10.2)
Chloride: 100 mmol/L (ref 96–106)
Creatinine, Ser: 0.76 mg/dL (ref 0.76–1.27)
Globulin, Total: 2.7 g/dL (ref 1.5–4.5)
Glucose: 181 mg/dL — ABNORMAL HIGH (ref 70–99)
Potassium: 3.7 mmol/L (ref 3.5–5.2)
Sodium: 140 mmol/L (ref 134–144)
Total Protein: 7.3 g/dL (ref 6.0–8.5)
eGFR: 103 mL/min/{1.73_m2} (ref >59)

## 2022-05-16 LAB — LIPID PANEL
Chol/HDL Ratio: 2.5 ratio (ref 0.0–5.0)
Cholesterol, Total: 109 mg/dL (ref 100–199)
HDL: 44 mg/dL
LDL Chol Calc (NIH): 44 mg/dL (ref 0–99)
Triglycerides: 118 mg/dL (ref 0–149)
VLDL Cholesterol Cal: 21 mg/dL (ref 5–40)

## 2022-05-18 ENCOUNTER — Other Ambulatory Visit: Payer: Self-pay | Admitting: Internal Medicine

## 2022-05-20 ENCOUNTER — Other Ambulatory Visit: Payer: Self-pay

## 2022-05-20 ENCOUNTER — Other Ambulatory Visit: Payer: Self-pay | Admitting: Family Medicine

## 2022-05-20 MED ORDER — LOSARTAN POTASSIUM-HCTZ 100-25 MG PO TABS: 1.0000 | ORAL_TABLET | Freq: Every day | ORAL | 1 refills | Status: DC

## 2022-05-20 NOTE — Telephone Encounter (Signed)
Refilled 03/22/2022 #30 2 refills. Requested Prescriptions  Pending Prescriptions Disp Refills  . atorvastatin (LIPITOR) 40 MG tablet [Pharmacy Med Name: ATORVASTATIN 40 MG TAB[*]] 30 tablet 2    Sig: TAKE ONE TABLET BY MOUTH ONE TIME DAILY     Cardiovascular:  Antilipid - Statins Failed - 05/18/2022  2:13 PM      Failed - Lipid Panel in normal range within the last 12 months    Cholesterol, Total  Date Value Ref Range Status  05/15/2022 109 100 - 199 mg/dL Final   LDL Chol Calc (NIH)  Date Value Ref Range Status  05/15/2022 44 0 - 99 mg/dL Final   HDL  Date Value Ref Range Status  05/15/2022 44 >39 mg/dL Final   Triglycerides  Date Value Ref Range Status  05/15/2022 118 0 - 149 mg/dL Final         Passed - Patient is not pregnant      Passed - Valid encounter within last 12 months    Recent Outpatient Visits          5 days ago Type 2 diabetes mellitus without complication, without long-term current use of insulin ([place])   [facility] Vigg, Avanti, MD   1 month ago Type 2 diabetes mellitus without complication, without long-term current use of insulin ([place])   [facility] Vigg, Avanti, MD   3 months ago Type 2 diabetes mellitus without complication, without long-term current use of insulin ([place])   [facility] Vigg, Avanti, MD   4 months ago Diabetes mellitus without complication ([place])   [facility] Vigg, Avanti, MD   7 months ago Type 2 diabetes mellitus without complication, without long-term current use of insulin ([place])   [place], Avanti, MD      Future Appointments            In 3 months Kathrine Haddock, NP [place]

## 2022-06-07 ENCOUNTER — Other Ambulatory Visit: Payer: Self-pay

## 2022-06-07 DIAGNOSIS — G4733 Obstructive sleep apnea (adult) (pediatric): Secondary | ICD-10-CM | POA: Diagnosis not present

## 2022-06-07 DIAGNOSIS — I10 Essential (primary) hypertension: Secondary | ICD-10-CM | POA: Diagnosis not present

## 2022-06-07 MED ORDER — RYBELSUS 14 MG PO TABS: 14.0000 mg | ORAL_TABLET | Freq: Every day | ORAL | 3 refills | Status: DC

## 2022-07-08 ENCOUNTER — Other Ambulatory Visit: Payer: Self-pay

## 2022-07-08 MED ORDER — EMPAGLIFLOZIN 25 MG PO TABS: 25.0000 mg | ORAL_TABLET | Freq: Every day | ORAL | 1 refills | Status: DC

## 2022-07-08 NOTE — Telephone Encounter (Signed)
LOV 05/15/22  Future appt 08/20/22

## 2022-07-12 ENCOUNTER — Other Ambulatory Visit: Payer: Self-pay

## 2022-07-12 DIAGNOSIS — E119 Type 2 diabetes mellitus without complications: Secondary | ICD-10-CM

## 2022-07-12 MED ORDER — METFORMIN HCL 1000 MG PO TABS: 1000.0000 mg | ORAL_TABLET | Freq: Two times a day (BID) | ORAL | 0 refills | Status: DC

## 2022-07-12 NOTE — Telephone Encounter (Signed)
LOV  05/15/22  Future appt 08/20/22 with Malachy Mood

## 2022-07-16 ENCOUNTER — Other Ambulatory Visit: Payer: Self-pay

## 2022-07-16 MED ORDER — ATORVASTATIN CALCIUM 40 MG PO TABS: 40.0000 mg | ORAL_TABLET | Freq: Every day | ORAL | 2 refills | Status: DC

## 2022-07-16 NOTE — Telephone Encounter (Signed)
LOV 05/15/22  Future appt 08/20/22 w/ Kathrine Haddock

## 2022-07-19 ENCOUNTER — Encounter: Payer: Self-pay | Admitting: Internal Medicine

## 2022-08-13 ENCOUNTER — Ambulatory Visit: Payer: Self-pay

## 2022-08-13 ENCOUNTER — Encounter: Payer: Self-pay | Admitting: Internal Medicine

## 2022-08-13 NOTE — Telephone Encounter (Signed)
Pt states he has been weak for x3w   Pt states he does not know if it is his sugar   Please assist pt further   Left message to call back. Used Spanish interpreter # [id number].

## 2022-08-13 NOTE — Telephone Encounter (Signed)
  Chief Complaint: weakness Symptoms: 3 wks Frequency: always Pertinent Negatives: Patient denies fever, falling Disposition: '[]'$ ED /'[]'$ Urgent Care (no appt availability in office) / '[x]'$ Appointment(In office/virtual)/ '[]'$  [place] [facility]/ '[]'$ Home Care/ '[]'$ Refused Recommended Disposition /'[]'$ [place] [facility]/ '[]'$  Follow-up with PCP Additional Notes: Appt for tomorrow morning. Home care reviewed.  Reason for Disposition  [1] MODERATE weakness (i.e., interferes with work, school, normal activities) AND [2] persists > 3 days  Answer Assessment - Initial Assessment Questions 1. DESCRIPTION: "Describe how you are feeling."     weak 2. SEVERITY: "How bad is it?"  "Can you stand and walk?"   - MILD (0-3): Feels weak or tired, but does not interfere with work, school or normal activities.   - MODERATE (4-7): Able to stand and walk; weakness interferes with work, school, or normal activities.   - SEVERE (8-10): Unable to stand or walk; unable to do usual activities.     Feels weak from even walking 3. ONSET: "When did these symptoms begin?" (e.g., hours, days, weeks, months)     3 weeks 4. CAUSE: "What do you think is causing the weakness or fatigue?" (e.g., not drinking enough fluids, medical problem, trouble sleeping)     unknown 5. NEW MEDICINES:  "Have you started on any new medicines recently?" (e.g., opioid pain medicines, benzodiazepines, muscle relaxants, antidepressants, antihistamines, neuroleptics, beta blockers)     no 6. OTHER SYMPTOMS: "Do you have any other symptoms?" (e.g., chest pain, fever, cough, SOB, vomiting, diarrhea, bleeding, other areas of pain)     no 7. PREGNANCY: "Is there any chance you are pregnant?" "When was your last menstrual period?"     Na  Protocols used: Weakness (Generalized) and Fatigue-A-AH

## 2022-08-14 ENCOUNTER — Encounter: Payer: Self-pay | Admitting: Physician Assistant

## 2022-08-14 ENCOUNTER — Ambulatory Visit: Payer: Medicaid Other | Admitting: Physician Assistant

## 2022-08-14 VITALS — BP 100/68 | HR 76 | Temp 97.6°F | Ht 66.93 in | Wt 183.6 lb

## 2022-08-14 DIAGNOSIS — J3089 Other allergic rhinitis: Secondary | ICD-10-CM

## 2022-08-14 DIAGNOSIS — G8929 Other chronic pain: Secondary | ICD-10-CM

## 2022-08-14 DIAGNOSIS — Z8639 Personal history of other endocrine, nutritional and metabolic disease: Secondary | ICD-10-CM | POA: Diagnosis not present

## 2022-08-14 DIAGNOSIS — Z1329 Encounter for screening for other suspected endocrine disorder: Secondary | ICD-10-CM | POA: Diagnosis not present

## 2022-08-14 DIAGNOSIS — R5383 Other fatigue: Secondary | ICD-10-CM | POA: Diagnosis not present

## 2022-08-14 DIAGNOSIS — E785 Hyperlipidemia, unspecified: Secondary | ICD-10-CM | POA: Diagnosis not present

## 2022-08-14 DIAGNOSIS — I10 Essential (primary) hypertension: Secondary | ICD-10-CM | POA: Diagnosis not present

## 2022-08-14 DIAGNOSIS — E559 Vitamin D deficiency, unspecified: Secondary | ICD-10-CM | POA: Diagnosis not present

## 2022-08-14 DIAGNOSIS — M545 Low back pain, unspecified: Secondary | ICD-10-CM | POA: Diagnosis not present

## 2022-08-14 DIAGNOSIS — E119 Type 2 diabetes mellitus without complications: Secondary | ICD-10-CM | POA: Diagnosis not present

## 2022-08-14 MED ORDER — METFORMIN HCL 1000 MG PO TABS: 1000.0000 mg | ORAL_TABLET | Freq: Two times a day (BID) | ORAL | 3 refills | Status: DC

## 2022-08-14 MED ORDER — LOSARTAN POTASSIUM-HCTZ 100-25 MG PO TABS: 1.0000 | ORAL_TABLET | Freq: Every day | ORAL | 1 refills | Status: DC

## 2022-08-14 MED ORDER — ATORVASTATIN CALCIUM 40 MG PO TABS: 40.0000 mg | ORAL_TABLET | Freq: Every day | ORAL | 1 refills | Status: DC

## 2022-08-14 MED ORDER — LIDOCAINE 5 % EX PTCH: 1.0000 | MEDICATED_PATCH | CUTANEOUS | 1 refills | Status: DC

## 2022-08-14 MED ORDER — RYBELSUS 14 MG PO TABS: 14.0000 mg | ORAL_TABLET | Freq: Every day | ORAL | 1 refills | Status: DC

## 2022-08-14 MED ORDER — FEXOFENADINE HCL 180 MG PO TABS: 180.0000 mg | ORAL_TABLET | Freq: Every day | ORAL | 1 refills | Status: DC

## 2022-08-14 NOTE — Assessment & Plan Note (Signed)
Acute, new concern Patient reports he has felt fatigue and reduced strength for the last 2 weeks Denies symptoms of acute infection, denies medication changes, changes to daily routine Will check CMP, CBC, B12, Vitamin D and TSH for potential etiology  Results to dictate further management Follow up as needed for persistent or progressing symptoms

## 2022-08-14 NOTE — Assessment & Plan Note (Signed)
Chronic, historic condition Reviewed previous cholesterol panel - in goal  He is taking Atorvastatin 40 mg PO QD and appears to be tolerating well Continue current medications and exercise efforts Follow up in 3 months for monitoring

## 2022-08-14 NOTE — Assessment & Plan Note (Signed)
Chronic, historic condition Appears well controlled on current regimen of Hyzaar 100-25 mg PO QD and Amlodipine 5 mg PO QD  Continue current medications Recommend he engage in diet and exercise efforts to further assist with management Follow up in 3 months for monitoring

## 2022-08-14 NOTE — Assessment & Plan Note (Signed)
Chronic, historic condition  Currently taking metformin 1000 mg PO BID, Rybelsus 14 mg PO QD, jardiance 25 mg PO QD - appears to be tolerating well- continue current medications pending lab results Will recheck A1c today for monitoring  Follow up in 3 months for monitoring

## 2022-08-14 NOTE — Assessment & Plan Note (Signed)
Chronic condition Patient reports he is using Lidocaine patches for relief and requests refills Recommend re-evaluation in coming months to monitor progress Follow in in 3 months for monitoring

## 2022-08-14 NOTE — Progress Notes (Signed)
Established Patient Office Visit  Name: Devin Arias   MRN: [id number]    DOB: 30-Nov-1961   Date:08/14/2022  Today's Provider: Talitha Givens, MHS, PA-C Introduced myself to the patient as a PA-C and provided education on APPs in clinical practice.         Subjective  Chief Complaint  Chief Complaint  Patient presents with   Fatigue    For the last 3 months    HPI    Weakness/ fatigue  Onset: sudden Duration: started about 2 weeks ago Reports he feels like he can hardly walk and feels tired throughout the day Reports reduced strength and fatigue     Diabetes, Type 2 - Last A1c 8.6 on 05/15/22 - Medications: metformin 1000 mg PO BID, Rybelsus 14 mg PO QD, jardiance 25 mg PO QD  - Compliance: excellent  - Checking BG at home: Checking blood sugars almost everyday - avg <200, today was 351  - Diet: States he tries to reduce excess sugar in diet.  - Exercise: He is exercising, walking about 20 minutes M-F  - Eye exam: he has had eye exam in August. Reports they performed diabetic eye exam- [place]  - Foot exam: will complete at next apt  - Microalbumin: performed in June  - Statin: on statin - PNA vaccine: NA - Denies symptoms of hypoglycemia, polyuria, polydipsia, numbness extremities, foot ulcers/trauma    HYPERTENSION / [place] Satisfied with current treatment? yes Duration of hypertension: chronic BP monitoring frequency: a few times a week BP range: 110/70-80 BP medication side effects: no Past BP meds: amlodipine, HCTZ, and losartan (cozaar) Duration of hyperlipidemia: chronic Cholesterol medication side effects: no Cholesterol supplements: none Past cholesterol medications: atorvastain (lipitor) Medication compliance: excellent compliance Aspirin: no Recent stressors: no Recurrent headaches: no Visual changes: no Palpitations: no Dyspnea: no Chest pain: no Lower extremity edema: no Dizzy/lightheaded:  no     Patient Active Problem List   Diagnosis Date Noted   Primary hypertension 08/14/2022   Environmental and seasonal allergies 08/14/2022   Chronic low back pain 08/14/2022   Fatigue 08/14/2022   Hyperlipidemia 03/27/2022   Type 2 diabetes mellitus without complication, without long-term current use of insulin ([place]) 02/13/2022    Past Surgical History:  Procedure Laterality Date   CHOLECYSTECTOMY     COLONOSCOPY WITH PROPOFOL N/A 08/02/2021   Procedure: COLONOSCOPY WITH PROPOFOL;  Surgeon: Jonathon Bellows, MD;  Location: [facility] ENDOSCOPY;  Service: Gastroenterology;  Laterality: N/A;  PATIENT SAYS HE DOES NOT INTERPRETER    History reviewed. No pertinent family history.  Social History   Tobacco Use   Smoking status: Never   Smokeless tobacco: Never  Substance Use Topics   Alcohol use: Never     Current Outpatient Medications:    amLODipine (NORVASC) 5 MG tablet, Take 1 tablet (5 mg total) by mouth daily., Disp: 90 tablet, Rfl: 3   empagliflozin (JARDIANCE) 25 MG TABS tablet, Take 1 tablet (25 mg total) by mouth daily before breakfast., Disp: 90 tablet, Rfl: 1   ibuprofen (ADVIL) 600 MG tablet, Take 600 mg by mouth 2 (two) times daily as needed., Disp: , Rfl:    atorvastatin (LIPITOR) 40 MG tablet, Take 1 tablet (40 mg total) by mouth daily., Disp: 90 tablet, Rfl: 1   fexofenadine (ALLEGRA ALLERGY) 180 MG tablet, Take 1 tablet (180 mg total) by mouth daily., Disp: 30 tablet, Rfl: 1   lidocaine (LIDODERM) 5 %,  Place 1 patch onto the skin daily. Remove & Discard patch within 12 hours or as directed by MD, Disp: 90 patch, Rfl: 1   losartan-hydrochlorothiazide (HYZAAR) 100-25 MG tablet, Take 1 tablet by mouth daily., Disp: 90 tablet, Rfl: 1   metFORMIN (GLUCOPHAGE) 1000 MG tablet, Take 1 tablet (1,000 mg total) by mouth 2 (two) times daily with a meal., Disp: 180 tablet, Rfl: 3   Semaglutide (RYBELSUS) 14 MG TABS, Take 1 tablet (14 mg total) by mouth daily., Disp: 90 tablet, Rfl:  1  No Known Allergies  I personally reviewed active problem list, medication list, allergies, health maintenance, notes from last encounter, lab results with the patient/caregiver today.   Review of Systems  Constitutional:  Positive for malaise/fatigue. Negative for chills, fever and weight loss.  Eyes:  Negative for blurred vision and double vision.  Cardiovascular:  Negative for chest pain, palpitations and leg swelling.  Gastrointestinal:  Negative for blood in stool, constipation, diarrhea, heartburn, nausea and vomiting.  Genitourinary:  Positive for frequency. Negative for dysuria, flank pain and hematuria.  Musculoskeletal:  Negative for falls.  Neurological:  Positive for weakness. Negative for dizziness, tingling, tremors, loss of consciousness and headaches.  Endo/Heme/Allergies:  Positive for polydipsia.      Objective  Vitals:   08/14/22 1028  BP: 100/68  Pulse: 76  Temp: 97.6 F (36.4 C)  TempSrc: Oral  SpO2: 97%  Weight: 183 lb 9.6 oz (83.3 kg)  Height: 5' 6.93" (1.7 m)    Body mass index is 28.82 kg/m.  Physical Exam Vitals reviewed.  Constitutional:      General: He is awake.     Appearance: Normal appearance. He is well-developed, well-groomed and overweight.  HENT:     Head: Normocephalic and atraumatic.     Mouth/Throat:     Lips: Pink.     Pharynx: Oropharynx is clear. Uvula midline. No oropharyngeal exudate or posterior oropharyngeal erythema.  Eyes:     General: Lids are normal. Gaze aligned appropriately.     Pupils: Pupils are equal, round, and reactive to light.  Neck:     Thyroid: No thyroid mass, thyromegaly or thyroid tenderness.  Cardiovascular:     Rate and Rhythm: Normal rate and regular rhythm.     Pulses: Normal pulses.          Radial pulses are 2+ on the right side and 2+ on the left side.     Heart sounds: Normal heart sounds. No murmur heard.    No friction rub. No gallop.  Pulmonary:     Effort: Pulmonary effort is  normal.     Breath sounds: Normal breath sounds. No decreased air movement. No decreased breath sounds, wheezing, rhonchi or rales.  Musculoskeletal:     Cervical back: Full passive range of motion without pain, normal range of motion and neck supple.     Right lower leg: No edema.     Left lower leg: No edema.  Lymphadenopathy:     Head:     Right side of head: No submental or submandibular adenopathy.     Left side of head: No submental or submandibular adenopathy.     Upper Body:     Right upper body: No supraclavicular adenopathy.     Left upper body: No supraclavicular adenopathy.  Neurological:     General: No focal deficit present.     Mental Status: He is alert and oriented to person, place, and time.     GCS: GCS  eye subscore is 4. GCS verbal subscore is 5. GCS motor subscore is 6.     Cranial Nerves: No dysarthria or facial asymmetry.     Motor: Weakness present. No tremor, atrophy or abnormal muscle tone.     Gait: Gait is intact.  Psychiatric:        Attention and Perception: Attention and perception normal.        Mood and Affect: Mood and affect normal.        Speech: Speech normal.        Behavior: Behavior normal. Behavior is cooperative.      No results found for this or any previous visit (from the past 2160 hour(s)).   PHQ2/9:    05/15/2022    8:23 AM 03/27/2022    9:07 AM 02/13/2022   10:18 AM 12/26/2021   10:54 AM 10/15/2021    2:08 PM  Depression screen PHQ 2/9  Decreased Interest 0 0 0 0 0  Down, Depressed, Hopeless 0 0 0 0 0  PHQ - 2 Score 0 0 0 0 0  Altered sleeping 0 0 0 1 0  Tired, decreased energy 0 0 0 0 0  Change in appetite 0 0 0 0 0  Feeling bad or failure about yourself  0 0 0 0 0  Trouble concentrating 0 0 0 0 0  Moving slowly or fidgety/restless 0 0 0 0 0  Suicidal thoughts 0 0 0 0 0  PHQ-9 Score 0 0 0 1 0  Difficult doing work/chores Not difficult at all Not difficult at all Not difficult at all Not difficult at all Not difficult at  all      Fall Risk:    05/15/2022    8:23 AM 03/27/2022    9:06 AM 02/13/2022   10:17 AM 12/26/2021   10:54 AM 10/15/2021    2:08 PM  Fall Risk   Falls in the past year? 0 0 0 0 0  Number falls in past yr: 0 0 0 0 0  Injury with Fall? 0 0 0 0 0  Risk for fall due to : _0   Follow up _1       Functional Status Survey:      Assessment & Plan  Problem List Items Addressed This Visit       Cardiovascular and Mediastinum   Primary hypertension    Chronic, historic condition Appears well controlled on current regimen of Hyzaar 100-25 mg PO QD and Amlodipine 5 mg PO QD  Continue current medications Recommend he engage in diet and exercise efforts to further assist with management Follow up in 3 months for monitoring       Relevant Medications   losartan-hydrochlorothiazide (HYZAAR) 100-25 MG tablet   atorvastatin (LIPITOR) 40 MG tablet   Other Relevant Orders   CBC w/Diff     Endocrine   Type 2 diabetes mellitus without complication, without long-term current use of insulin (HCC)    Chronic, historic condition  Currently taking metformin 1000 mg PO BID, Rybelsus 14 mg PO QD, jardiance 25 mg PO QD - appears to be tolerating well- continue current medications pending lab results Will recheck A1c today for monitoring  Follow up in 3 months for monitoring       Relevant Medications   Semaglutide (RYBELSUS) 14 MG TABS   metFORMIN (GLUCOPHAGE)  1000 MG tablet   losartan-hydrochlorothiazide (HYZAAR) 100-25 MG tablet   atorvastatin (LIPITOR) 40 MG tablet   Other Relevant Orders   Comp Met (CMET)   HgB A1c     Other   Hyperlipidemia - Primary    Chronic, historic condition Reviewed previous cholesterol panel - in goal  He is taking Atorvastatin 40 mg PO QD and appears to be  tolerating well Continue current medications and exercise efforts Follow up in 3 months for monitoring       Relevant Medications   losartan-hydrochlorothiazide (HYZAAR) 100-25 MG tablet   atorvastatin (LIPITOR) 40 MG tablet   Environmental and seasonal allergies   Relevant Medications   fexofenadine (ALLEGRA ALLERGY) 180 MG tablet   Chronic low back pain    Chronic condition Patient reports he is using Lidocaine patches for relief and requests refills Recommend re-evaluation in coming months to monitor progress Follow in in 3 months for monitoring       Relevant Medications   lidocaine (LIDODERM) 5 %   Fatigue    Acute, new concern Patient reports he has felt fatigue and reduced strength for the last 2 weeks Denies symptoms of acute infection, denies medication changes, changes to daily routine Will check CMP, CBC, B12, Vitamin D and TSH for potential etiology  Results to dictate further management Follow up as needed for persistent or progressing symptoms      Relevant Orders   Comp Met (CMET)   CBC w/Diff   TSH   B12   Vitamin D (25 hydroxy)   Other Visit Diagnoses     H/O non anemic vitamin B12 deficiency       Relevant Orders   B12   Screening for thyroid disorder       Relevant Orders   TSH        Return in about 3 months (around 11/13/2022) for Diabetes follow up, HTN.   I, [name] Mecum, PA-C, have reviewed all documentation for this visit. The documentation on 08/14/22 for the exam, diagnosis, procedures, and orders are all accurate and complete.   Erin Mecum, MHS, PA-C [facility]   

## 2022-08-15 LAB — CBC WITH DIFFERENTIAL/PLATELET
Basophils Absolute: 0 10*3/uL (ref 0.0–0.2)
Basos: 1 %
EOS (ABSOLUTE): 0.2 10*3/uL (ref 0.0–0.4)
Eos: 3 %
Hematocrit: 46.3 % (ref 37.5–51.0)
Hemoglobin: 15.7 g/dL (ref 13.0–17.7)
Immature Grans (Abs): 0 10*3/uL (ref 0.0–0.1)
Immature Granulocytes: 0 %
Lymphocytes Absolute: 2 10*3/uL (ref 0.7–3.1)
Lymphs: 31 %
MCH: 29.1 pg (ref 26.6–33.0)
MCHC: 33.9 g/dL (ref 31.5–35.7)
MCV: 86 fL (ref 79–97)
Monocytes Absolute: 0.5 10*3/uL (ref 0.1–0.9)
Monocytes: 7 %
Neutrophils Absolute: 3.8 10*3/uL (ref 1.4–7.0)
Neutrophils: 58 %
Platelets: 236 10*3/uL (ref 150–450)
RBC: 5.4 x10E6/uL (ref 4.14–5.80)
RDW: 13.2 % (ref 11.6–15.4)
WBC: 6.6 10*3/uL (ref 3.4–10.8)

## 2022-08-15 LAB — COMPREHENSIVE METABOLIC PANEL
ALT: 23 IU/L (ref 0–44)
AST: 15 IU/L (ref 0–40)
Albumin/Globulin Ratio: 1.6 (ref 1.2–2.2)
Albumin: 4.5 g/dL (ref 3.9–4.9)
Alkaline Phosphatase: 136 IU/L — ABNORMAL HIGH (ref 44–121)
BUN/Creatinine Ratio: 15 (ref 10–24)
BUN: 15 mg/dL (ref 8–27)
Bilirubin Total: 0.5 mg/dL (ref 0.0–1.2)
CO2: 22 mmol/L (ref 20–29)
Calcium: 9.6 mg/dL (ref 8.6–10.2)
Chloride: 94 mmol/L — ABNORMAL LOW (ref 96–106)
Creatinine, Ser: 1.02 mg/dL (ref 0.76–1.27)
Globulin, Total: 2.9 g/dL (ref 1.5–4.5)
Glucose: 410 mg/dL — ABNORMAL HIGH (ref 70–99)
Potassium: 3.9 mmol/L (ref 3.5–5.2)
Sodium: 135 mmol/L (ref 134–144)
Total Protein: 7.4 g/dL (ref 6.0–8.5)
eGFR: 84 mL/min/{1.73_m2} (ref >59)

## 2022-08-15 LAB — HEMOGLOBIN A1C
Est. average glucose Bld gHb Est-mCnc: 252 mg/dL
Hgb A1c MFr Bld: 10.4 % — ABNORMAL HIGH (ref 4.8–5.6)

## 2022-08-15 LAB — VITAMIN D 25 HYDROXY (VIT D DEFICIENCY, FRACTURES): Vit D, 25-Hydroxy: 24.5 ng/mL — ABNORMAL LOW (ref 30.0–100.0)

## 2022-08-15 LAB — VITAMIN B12: Vitamin B-12: 1909 pg/mL — ABNORMAL HIGH (ref 232–1245)

## 2022-08-15 LAB — TSH: TSH: 1.54 u[IU]/mL (ref 0.450–4.500)

## 2022-08-19 ENCOUNTER — Telehealth: Payer: Self-pay | Admitting: *Deleted

## 2022-08-19 NOTE — Telephone Encounter (Signed)
Pt calling fr results. Result message read to pt, verbalizes understanding:  "Your A1c is significantly elevated at 10.4 - we will need to have you return to the office with your medications (bottles) to make sure you are taking them correctly and we may need to add another agent to get this better controlled. Please schedule an apt with [place] in the next 1-2 weeks Your vitamin D is low- please start taking 600-800 units of vitamin D per day and we will recheck this in 3 months- this can contribute to your tiredness so fixing  it may help with that  CBC and thyroid hormones were normal Your CMP show elevated liver enzymes but this seems to be stable and normal for you based on previous results.  Your B12 is above the upper normal limit- if you are supplementing B12 you can stop this at this time."  Appt secured for 08/28/22. Pt requesting Vit D sent in as prescription to pharmacy instead of purchasing OTC. Aware available OTC.

## 2022-08-20 ENCOUNTER — Ambulatory Visit: Payer: Medicaid Other | Admitting: Unknown Physician Specialty

## 2022-08-27 ENCOUNTER — Ambulatory Visit: Payer: Medicaid Other | Admitting: Unknown Physician Specialty

## 2022-08-28 ENCOUNTER — Other Ambulatory Visit: Payer: Medicaid Other

## 2022-08-28 ENCOUNTER — Encounter: Payer: Self-pay | Admitting: Physician Assistant

## 2022-08-28 ENCOUNTER — Telehealth: Payer: Medicaid Other | Admitting: Physician Assistant

## 2022-08-28 VITALS — BP 125/73 | HR 71 | Temp 97.7°F | Wt 184.5 lb

## 2022-08-28 DIAGNOSIS — E1165 Type 2 diabetes mellitus with hyperglycemia: Secondary | ICD-10-CM | POA: Diagnosis not present

## 2022-08-28 DIAGNOSIS — R5383 Other fatigue: Secondary | ICD-10-CM | POA: Diagnosis not present

## 2022-08-28 DIAGNOSIS — E119 Type 2 diabetes mellitus without complications: Secondary | ICD-10-CM | POA: Diagnosis not present

## 2022-08-28 LAB — GLUCOSE HEMOCUE WAIVED: Glu Hemocue Waived: 384 mg/dL — ABNORMAL HIGH (ref 70–99)

## 2022-08-28 MED ORDER — TIRZEPATIDE 2.5 MG/0.5ML ~~LOC~~ SOAJ: 2.5000 mg | SUBCUTANEOUS | 2 refills | Status: DC

## 2022-08-28 NOTE — Progress Notes (Signed)
Virtual Visit via Video Note  I connected with [name] on 08/29/22 at  9:40 AM EDT by a video enabled telemedicine application and verified that I am speaking with the correct person using two identifiers.  Today's Provider: Talitha Givens, MHS, PA-C Introduced myself to the patient as a PA-C and provided education on APPs in clinical practice.   Chief Complaint  Patient presents with   Diabetes     Location: Patient: Dev[place] Arias, [place], [place]  Provider: at home, [place], [place]    I discussed the limitations of evaluation and management by telemedicine and the availability of in person appointments. The patient expressed understanding and agreed to proceed.  History of Present Illness:  Diabetes, Type 2 - Last A1c 10.4 - Medications: Jardiance 25 mg PO QD, Metformin 1000 mg PO BID, Semaglutide 14 mg PO QD  - Compliance: excellent  - Checking BG at home: Checking everyday- fasting avg is 280-320  - Diet: Reports typical day is  Light breakfast, doesn't typically eat lunch, for dinner usually eats chicken, rice,  - Exercise: Exercising M-F, walking 20 minutes per day.  - Eye exam: Discussed importance of annual diabetic eye exam to screen for retinopathy- discussed having results sent to office after this is completed to keep chart up to date.  - Foot exam: complete at next follow up  - Microalbumin: up to date  - Statin: On Atorvastatin 40 mg PO QD  - PNA vaccine: not due yet  - Denies symptoms of hypoglycemia, polyuria, polydipsia, numbness extremities, foot ulcers/trauma  A1c was 10.4 at previous visit Discussed importance of medication compliance   He denies thyroid problems in family    He reports his weakness and fatigue has improved since last visit Has been taking Vitamin D 5000 units PO QD Recommend he begin taking this once per week at this time- will recheck vitamin d levels at follow up.   Review of Systems  Constitutional:   Negative for chills, fever and malaise/fatigue.  Eyes:  Negative for blurred vision and double vision.  Respiratory:  Negative for shortness of breath.   Cardiovascular:  Negative for chest pain.  Musculoskeletal:  Negative for falls.  Neurological:  Negative for dizziness, loss of consciousness and headaches.      Observations/Objective:  Due to the nature of the virtual visit, physical exam and observations are limited. Able to obtain the following observations:   Alert, oriented, Appears comfortable, in no acute distress.  No scleral injection, no appreciated hoarseness, tachypnea, wheeze or strider. Able to maintain conversation without visible strain.  No cough appreciated during visit.    Assessment and Plan:  Problem List Items Addressed This Visit       Endocrine   Uncontrolled type 2 diabetes mellitus with hyperglycemia ([place]) - Primary    Chronic, historic condition He is taking Metformin 1000 mg PO BID, jardiance 25 mg PO QD, and Rybelsus 14 mg PO QD Most recent A1c was 10.4  Recommend dc Rybelsus and add Mounjaro 2.5 mg IM once per week for better control He reports elevated fasting blood glucose and does not appear to be eating at regularly scheduled intervals Recommend he eat smaller meals with mix of protein and carbs  Will follow up in 3 months to recheck A1c and discuss response to [facility].       Relevant Medications   tirzepatide (MOUNJARO) 2.5 MG/0.5ML Pen   Other Relevant Orders   Glucose Hemocue Waived (Completed)  Other   Fatigue    Reports this is resolving since starting Vitamin D supplement States he has been taking Vitamin D 5000 units once per day since he was instructed to supplement Reviewed this with patient- recommend he switch to once weekly supplementing with 5000 units at this time Recheck Vitamin D in 3 months to assess progress.        Follow Up Instructions:    I discussed the assessment and treatment plan with the  patient. The patient was provided an opportunity to ask questions and all were answered. The patient agreed with the plan and demonstrated an understanding of the instructions.   The patient was advised to call back or seek an in-person evaluation if the symptoms worsen or if the condition fails to improve as anticipated.  I provided 15 minutes of non-face-to-face time during this encounter.  Return in about 3 months (around 11/27/2022) for Diabetes follow up.   I, Freyja Govea E Zahraa Bhargava, PA-C, have reviewed all documentation for this visit. The documentation on 08/29/22 for the exam, diagnosis, procedures, and orders are all accurate and complete.   Talitha Givens, MHS, PA-C [facility]

## 2022-08-28 NOTE — Assessment & Plan Note (Signed)
Chronic, historic condition He is taking Metformin 1000 mg PO BID, jardiance 25 mg PO QD, and Rybelsus 14 mg PO QD Most recent A1c was 10.4  Recommend dc Rybelsus and add Mounjaro 2.5 mg IM once per week for better control He reports elevated fasting blood glucose and does not appear to be eating at regularly scheduled intervals Recommend he eat smaller meals with mix of protein and carbs  Will follow up in 3 months to recheck A1c and discuss response to [facility].

## 2022-08-28 NOTE — Patient Instructions (Signed)
Please stop taking your Rybelsus  Please start taking the [facility] - once per week

## 2022-08-29 ENCOUNTER — Ambulatory Visit: Payer: Medicaid Other | Admitting: Physician Assistant

## 2022-08-29 NOTE — Assessment & Plan Note (Signed)
Reports this is resolving since starting Vitamin D supplement States he has been taking Vitamin D 5000 units once per day since he was instructed to supplement Reviewed this with patient- recommend he switch to once weekly supplementing with 5000 units at this time Recheck Vitamin D in 3 months to assess progress.

## 2022-08-30 ENCOUNTER — Other Ambulatory Visit: Payer: Self-pay

## 2022-08-30 ENCOUNTER — Telehealth: Payer: Self-pay | Admitting: Internal Medicine

## 2022-08-30 MED ORDER — GLUCOSE BLOOD VI STRP: ORAL_STRIP | 12 refills | Status: DC

## 2022-08-30 NOTE — Telephone Encounter (Signed)
Test strips are not on current list, routing for approval.

## 2022-08-30 NOTE — Telephone Encounter (Signed)
Refill request sent to provider.

## 2022-08-30 NOTE — Telephone Encounter (Signed)
[facility] called to request refill for accu chek test strips / please advise

## 2022-09-03 ENCOUNTER — Telehealth: Payer: Self-pay | Admitting: Family Medicine

## 2022-09-03 ENCOUNTER — Telehealth: Payer: Self-pay

## 2022-09-03 NOTE — Telephone Encounter (Signed)
PA for [facility] 2.'5mg'$ /0.69m has been approved through 09/04/2023

## 2022-09-03 NOTE — Telephone Encounter (Signed)
Devin Arias with [facility] reports that pt insurance will not cover the Rx for tirzepatide [facility]) 2.5 MG/0.5ML Pen. Devin Arias requests that a Rx for an alternate medication be submitted.  [facility] [address] - [place], [place] [employer] [name] Phone:  [phone]  Fax:  [phone]

## 2022-09-03 NOTE — Telephone Encounter (Signed)
Patient made aware of results and verbalized understanding.  

## 2022-09-03 NOTE — Telephone Encounter (Signed)
PA for [facility] was started this morning.

## 2022-09-03 NOTE — Telephone Encounter (Signed)
PA started for Mounjaro 2.5 mg/0.25m  through Covermy meds. Awaiting on determination

## 2022-09-04 ENCOUNTER — Telehealth: Payer: Self-pay

## 2022-09-04 NOTE — Telephone Encounter (Signed)
[facility] sent in req for rx on Accu-check. Needing frequency. How many times a day should pt check sugar. Pharmacy  is also asking if pt is using accu-chek or a different meter. Please send new rx.

## 2022-09-05 MED ORDER — GLUCOSE BLOOD VI STRP: 1.0000 | ORAL_STRIP | Freq: Every day | 12 refills | Status: DC

## 2022-09-05 NOTE — Addendum Note (Signed)
Addended by: Jon Billings on: 09/05/2022 08:01 AM   Modules accepted: Orders

## 2022-09-05 NOTE — Telephone Encounter (Signed)
Updated prescription sent to the pharmacy. 

## 2022-09-06 ENCOUNTER — Other Ambulatory Visit: Payer: Self-pay

## 2022-09-06 MED ORDER — EMPAGLIFLOZIN 25 MG PO TABS: 25.0000 mg | ORAL_TABLET | Freq: Every day | ORAL | 1 refills | Status: DC

## 2022-09-06 NOTE — Telephone Encounter (Signed)
Medication refill for Jardiance last ov 08/28/22, upcoming ov 11/13/22 . Please advise

## 2022-10-01 ENCOUNTER — Encounter: Payer: Self-pay | Admitting: Physician Assistant

## 2022-10-01 DIAGNOSIS — R7989 Other specified abnormal findings of blood chemistry: Secondary | ICD-10-CM | POA: Insufficient documentation

## 2022-10-10 ENCOUNTER — Other Ambulatory Visit: Payer: Self-pay

## 2022-10-10 DIAGNOSIS — M545 Low back pain, unspecified: Secondary | ICD-10-CM

## 2022-10-10 NOTE — Telephone Encounter (Signed)
Medication refill for Lidocaine 5% patches last ov 08/28/22, upcoming ov 11/13/22 . Please advise

## 2022-10-15 LAB — HM DIABETES EYE EXAM

## 2022-10-16 DIAGNOSIS — H5213 Myopia, bilateral: Secondary | ICD-10-CM | POA: Diagnosis not present

## 2022-11-09 ENCOUNTER — Other Ambulatory Visit: Payer: Self-pay | Admitting: Family Medicine

## 2022-11-09 DIAGNOSIS — I10 Essential (primary) hypertension: Secondary | ICD-10-CM

## 2022-11-11 NOTE — Telephone Encounter (Signed)
Unable to refill per protocol, last refill by provider 08/14/22 for 90 and 1 refill. Will refuse.  Requested Prescriptions  Pending Prescriptions Disp Refills   losartan-hydrochlorothiazide (HYZAAR) 100-25 MG tablet [Pharmacy Med Name: LOSARTAN-HCTZ 100-25 MG TAB[*]] 90 tablet 1    Sig: TAKE ONE TABLET BY MOUTH ONE TIME DAILY     Cardiovascular: ARB + Diuretic Combos Passed - 11/09/2022  2:25 AM      Passed - K in normal range and within 180 days    Potassium  Date Value Ref Range Status  08/14/2022 3.9 3.5 - 5.2 mmol/L Final         Passed - Na in normal range and within 180 days    Sodium  Date Value Ref Range Status  08/14/2022 135 134 - 144 mmol/L Final         Passed - Cr in normal range and within 180 days    Creatinine, Ser  Date Value Ref Range Status  08/14/2022 1.02 0.76 - 1.27 mg/dL Final         Passed - eGFR is 10 or above and within 180 days    eGFR  Date Value Ref Range Status  08/14/2022 84 >59 mL/min/1.73 Final         Passed - Patient is not pregnant      Passed - Last BP in normal range    BP Readings from Last 1 Encounters:  08/28/22 125/73         Passed - Valid encounter within last 6 months    Recent Outpatient Visits           2 months ago Uncontrolled type 2 diabetes mellitus with hyperglycemia ([place])   [facility] Mecum, [name], PA-C   2 months ago Hyperlipidemia, unspecified hyperlipidemia type   [employer], [name], PA-C   6 months ago Type 2 diabetes mellitus without complication, without long-term current use of insulin ([place])   [facility] Vigg, Avanti, MD   7 months ago Type 2 diabetes mellitus without complication, without long-term current use of insulin ([place])   [facility] Vigg, Avanti, MD   9 months ago Type 2 diabetes mellitus without complication, without long-term current use of insulin (HCC)   [facility] Vigg, Avanti, MD

## 2022-11-13 ENCOUNTER — Ambulatory Visit: Payer: Medicaid Other | Admitting: Physician Assistant

## 2022-11-13 ENCOUNTER — Encounter: Payer: Self-pay | Admitting: Family Medicine

## 2022-11-13 ENCOUNTER — Ambulatory Visit: Payer: Medicaid Other | Admitting: Family Medicine

## 2022-11-13 VITALS — BP 114/71 | HR 69 | Temp 97.8°F | Ht 66.93 in | Wt 189.1 lb

## 2022-11-13 DIAGNOSIS — Z23 Encounter for immunization: Secondary | ICD-10-CM | POA: Diagnosis not present

## 2022-11-13 DIAGNOSIS — E1165 Type 2 diabetes mellitus with hyperglycemia: Secondary | ICD-10-CM

## 2022-11-13 DIAGNOSIS — L299 Pruritus, unspecified: Secondary | ICD-10-CM

## 2022-11-13 MED ORDER — TIRZEPATIDE 10 MG/0.5ML ~~LOC~~ SOAJ: 10.0000 mg | SUBCUTANEOUS | 0 refills | Status: DC

## 2022-11-13 MED ORDER — TIRZEPATIDE 7.5 MG/0.5ML ~~LOC~~ SOAJ: 7.5000 mg | SUBCUTANEOUS | 0 refills | Status: DC

## 2022-11-13 MED ORDER — GABAPENTIN 100 MG PO CAPS: 100.0000 mg | ORAL_CAPSULE | Freq: Every day | ORAL | 0 refills | Status: DC

## 2022-11-13 MED ORDER — TIRZEPATIDE 5 MG/0.5ML ~~LOC~~ SOAJ: 5.0000 mg | SUBCUTANEOUS | 0 refills | Status: DC

## 2022-11-13 MED ORDER — AMLODIPINE BESYLATE 5 MG PO TABS: 5.0000 mg | ORAL_TABLET | Freq: Every day | ORAL | 0 refills | Status: DC

## 2022-11-13 NOTE — Progress Notes (Signed)
BP 114/71   Pulse 69   Temp 97.8 F (36.6 C) (Oral)   Ht 5' 6.93" (1.7 m)   Wt 189 lb 1.6 oz (85.8 kg)   SpO2 98%   BMI 29.68 kg/m    Subjective:    Patient ID: Devin Arias, male    DOB: 11/26/1961, 61 y.o.   MRN: [id number]  HPI: ETHANAEL VEITH is a 61 y.o. male  Chief Complaint  Patient presents with   Pruritis    Patient says he is having some itching on her shoulder blades. Patient says he first noticed about a month ago and his wife has been using a cream.    Diabetes    Patient says he would like to discuss his previous A1c with provider as he was informed that it was elevated and he would like to discuss with provider at today's visit. Patient says he has been feeling a little sluggish and has been taking Vitamin D OTC and feels he is taking too much.    DIABETES- tolerating his mounjaro well.  Hypoglycemic episodes:no Polydipsia/polyuria: yes Visual disturbance: no Chest pain: no Paresthesias: no Glucose Monitoring: yes  Accucheck frequency: Daily  Fasting glucose: 220-230 Taking Insulin?: no Blood Pressure Monitoring: not checking Retinal Examination: Up to Date Foot Exam: Up to Date Diabetic Education: Completed Pneumovax: Not up to Date Influenza: Up to Date Aspirin: no  Relevant past medical, surgical, family and social history reviewed and updated as indicated. Interim medical history since our last visit reviewed. Allergies and medications reviewed and updated.  Review of Systems  Constitutional: Negative.   HENT: Negative.    Respiratory: Negative.    Cardiovascular: Negative.   Gastrointestinal: Negative.   Musculoskeletal: Negative.   Skin: Negative.        itching  Psychiatric/Behavioral: Negative.      Per HPI unless specifically indicated above     Objective:    BP 114/71   Pulse 69   Temp 97.8 F (36.6 C) (Oral)   Ht 5' 6.93" (1.7 m)   Wt 189 lb 1.6 oz (85.8 kg)   SpO2 98%   BMI 29.68 kg/m   Wt Readings from  Last 3 Encounters:  11/13/22 189 lb 1.6 oz (85.8 kg)  08/28/22 184 lb 8 oz (83.7 kg)  08/14/22 183 lb 9.6 oz (83.3 kg)    Physical Exam Vitals and nursing note reviewed.  Constitutional:      General: He is not in acute distress.    Appearance: Normal appearance. He is not ill-appearing, toxic-appearing or diaphoretic.  HENT:     Head: Normocephalic and atraumatic.     Right Ear: External ear normal.     Left Ear: External ear normal.     Nose: Nose normal.     Mouth/Throat:     Mouth: Mucous membranes are moist.     Pharynx: Oropharynx is clear.  Eyes:     General: No scleral icterus.       Right eye: No discharge.        Left eye: No discharge.     Extraocular Movements: Extraocular movements intact.     Conjunctiva/sclera: Conjunctivae normal.     Pupils: Pupils are equal, round, and reactive to light.  Cardiovascular:     Rate and Rhythm: Normal rate and regular rhythm.     Pulses: Normal pulses.     Heart sounds: Normal heart sounds. No murmur heard.    No friction rub. No gallop.  Pulmonary:  Effort: Pulmonary effort is normal. No respiratory distress.     Breath sounds: Normal breath sounds. No stridor. No wheezing, rhonchi or rales.  Chest:     Chest wall: No tenderness.  Musculoskeletal:        General: Normal range of motion.     Cervical back: Normal range of motion and neck supple.  Skin:    General: Skin is warm and dry.     Capillary Refill: Capillary refill takes less than 2 seconds.     Coloration: Skin is not jaundiced or pale.     Findings: No bruising, erythema, lesion or rash.  Neurological:     General: No focal deficit present.     Mental Status: He is alert and oriented to person, place, and time. Mental status is at baseline.  Psychiatric:        Mood and Affect: Mood normal.        Behavior: Behavior normal.        Thought Content: Thought content normal.        Judgment: Judgment normal.     Results for orders placed or performed in  visit on 10/23/22  HM DIABETES EYE EXAM  Result Value Ref Range   HM Diabetic Eye Exam No Retinopathy No Retinopathy      Assessment & Plan:   Problem List Items Addressed This Visit       Endocrine   Uncontrolled type 2 diabetes mellitus with hyperglycemia ([place]) - Primary    A1c too high to be read on waived machine. Will increase his mounjaro. Was previously on '14mg'$  rybelsus and was reduced to 2.'5mg'$  mounjaro. Will titrate up to '10mg'$  and will likely need to go higher. Recheck 3 months. Call with any concerns.       Relevant Medications   tirzepatide [facility]) 5 MG/0.5ML Pen   tirzepatide [facility]) 10 MG/0.5ML Pen (Start on 01/12/2023)   tirzepatide [facility]) 7.5 MG/0.5ML Pen (Start on 12/13/2022)   Other Relevant Orders   Bayer DCA Hb A1c Waived   Hgb A1c w/o eAG   Other Visit Diagnoses     Itching       No rash- will start gabapentin at bedtime. Call with any concerns. Recheck at follow up.        Follow up plan: Return in about 3 months (around 02/12/2023).

## 2022-11-13 NOTE — Assessment & Plan Note (Signed)
A1c too high to be read on waived machine. Will increase his mounjaro. Was previously on '14mg'$  rybelsus and was reduced to 2.'5mg'$  mounjaro. Will titrate up to '10mg'$  and will likely need to go higher. Recheck 3 months. Call with any concerns.

## 2022-11-14 LAB — HGB A1C W/O EAG: Hgb A1c MFr Bld: 9.4 % — ABNORMAL HIGH (ref 4.8–5.6)

## 2022-11-18 ENCOUNTER — Ambulatory Visit: Payer: Medicaid Other | Admitting: Physician Assistant

## 2022-12-09 ENCOUNTER — Other Ambulatory Visit: Payer: Self-pay

## 2022-12-09 NOTE — Telephone Encounter (Signed)
Entered in error

## 2022-12-12 ENCOUNTER — Ambulatory Visit: Payer: Medicaid Other | Admitting: Family Medicine

## 2022-12-12 ENCOUNTER — Encounter: Payer: Self-pay | Admitting: Family Medicine

## 2022-12-12 VITALS — BP 100/66 | HR 81 | Temp 98.2°F | Ht 66.9 in | Wt 180.4 lb

## 2022-12-12 DIAGNOSIS — E1165 Type 2 diabetes mellitus with hyperglycemia: Secondary | ICD-10-CM

## 2022-12-12 DIAGNOSIS — R197 Diarrhea, unspecified: Secondary | ICD-10-CM | POA: Diagnosis not present

## 2022-12-12 LAB — URINALYSIS, ROUTINE W REFLEX MICROSCOPIC
Bilirubin, UA: NEGATIVE
Ketones, UA: NEGATIVE
Leukocytes,UA: NEGATIVE
Nitrite, UA: NEGATIVE
Protein,UA: NEGATIVE
RBC, UA: NEGATIVE
Specific Gravity, UA: 1.015 (ref 1.005–1.030)
Urobilinogen, Ur: 0.2 mg/dL (ref 0.2–1.0)
pH, UA: 5 (ref 5.0–7.5)

## 2022-12-12 NOTE — Progress Notes (Signed)
BP 100/66   Pulse 81   Temp 98.2 F (36.8 C) (Oral)   Ht 5' 6.9" (1.699 m)   Wt 180 lb 6.4 oz (81.8 kg)   SpO2 98%   BMI 28.34 kg/m    Subjective:    Patient ID: Devin Arias, male    DOB: [birth year], 62 y.o.   MRN: [id number]  HPI: DUSTIN BUMBAUGH is a 61 y.o. male  Chief Complaint  Patient presents with   Back Pain   Diarrhea    Patient says he has been having Diarrhea since Sunday. Patient says he is losing too much weight and thinks he may be doing too much as far as his Diabetes medication.    GASTROENTERITIS Duration: 5 days Diarrhea: yes non-bloody  Episodes of diarrhea/day: every 1/2 hour Nausea: no Vomiting: no Episodes of vomit/day:  Abdominal pain: no Fever: no Decreased appetite: yes Tolerating liquids: yes Foreign travel: no Similar illness in contacts: no Recent antibiotic use: no Status: better  DIABETES Hypoglycemic episodes:no Polydipsia/polyuria: no Visual disturbance: no Chest pain: no Paresthesias: no Glucose Monitoring: no  Accucheck frequency: Not Checking Taking Insulin?: no Blood Pressure Monitoring: not checking Retinal Examination: Up to Date Foot Exam: Up to Date Diabetic Education: Completed Pneumovax: Up to Date Influenza: Up to Date Aspirin: no  Relevant past medical, surgical, family and social history reviewed and updated as indicated. Interim medical history since our last visit reviewed. Allergies and medications reviewed and updated.  Review of Systems  Constitutional: Negative.   Respiratory: Negative.    Cardiovascular: Negative.   Gastrointestinal:  Positive for diarrhea. Negative for abdominal distention, abdominal pain, anal bleeding, blood in stool, constipation, nausea, rectal pain and vomiting.  Genitourinary: Negative.   Musculoskeletal:  Positive for back pain. Negative for arthralgias, gait problem, joint swelling, myalgias, neck pain and neck stiffness.  Skin: Negative.    Psychiatric/Behavioral: Negative.      Per HPI unless specifically indicated above     Objective:    BP 100/66   Pulse 81   Temp 98.2 F (36.8 C) (Oral)   Ht 5' 6.9" (1.699 m)   Wt 180 lb 6.4 oz (81.8 kg)   SpO2 98%   BMI 28.34 kg/m   Wt Readings from Last 3 Encounters:  12/12/22 180 lb 6.4 oz (81.8 kg)  11/13/22 189 lb 1.6 oz (85.8 kg)  08/28/22 184 lb 8 oz (83.7 kg)    Physical Exam Vitals and nursing note reviewed.  Constitutional:      General: He is not in acute distress.    Appearance: Normal appearance. He is well-developed. He is not ill-appearing, toxic-appearing or diaphoretic.  HENT:     Head: Normocephalic and atraumatic.     Right Ear: External ear normal.     Left Ear: External ear normal.     Nose: Nose normal.     Mouth/Throat:     Mouth: Mucous membranes are moist.     Pharynx: Oropharynx is clear.  Eyes:     General: No scleral icterus.       Right eye: No discharge.        Left eye: No discharge.     Extraocular Movements: Extraocular movements intact.     Conjunctiva/sclera: Conjunctivae normal.     Pupils: Pupils are equal, round, and reactive to light.  Cardiovascular:     Rate and Rhythm: Normal rate and regular rhythm.     Pulses: Normal pulses.     Heart sounds: Normal  heart sounds. No murmur heard.    No friction rub. No gallop.  Pulmonary:     Effort: Pulmonary effort is normal. No respiratory distress.     Breath sounds: Normal breath sounds. No stridor. No wheezing, rhonchi or rales.  Chest:     Chest wall: No tenderness.  Abdominal:     General: Abdomen is flat. Bowel sounds are normal. There is no distension or abdominal bruit. There are no signs of injury.     Palpations: Abdomen is soft. There is no shifting dullness, fluid wave, hepatomegaly, splenomegaly, mass or pulsatile mass.     Tenderness: There is no abdominal tenderness.  Musculoskeletal:        General: Normal range of motion.     Cervical back: Normal range of  motion and neck supple.  Skin:    General: Skin is warm and dry.     Capillary Refill: Capillary refill takes less than 2 seconds.     Coloration: Skin is not jaundiced or pale.     Findings: No bruising, erythema, lesion or rash.  Neurological:     General: No focal deficit present.     Mental Status: He is alert and oriented to person, place, and time. Mental status is at baseline.  Psychiatric:        Mood and Affect: Mood normal.        Behavior: Behavior normal.        Thought Content: Thought content normal.        Judgment: Judgment normal.     Results for orders placed or performed in visit on 11/13/22  Hgb A1c w/o eAG  Result Value Ref Range   Hgb A1c MFr Bld 9.4 (H) 4.8 - 5.6 %      Assessment & Plan:   Problem List Items Addressed This Visit       Endocrine   Uncontrolled type 2 diabetes mellitus with hyperglycemia ([place])    Tolerating medicine well until this week. Will continue and recheck 2 months.       Other Visit Diagnoses     Diarrhea, unspecified type    -  Primary   Concern for GI bug- feeling better. Will check labs and encourage symptomatic care with hydration and gentle diet. Call if not better by Monday.   Relevant Orders   Comprehensive metabolic panel   Amylase   Lipase   Urinalysis, Routine w reflex microscopic   CBC with Differential/Platelet        Follow up plan: Return As scheduled.

## 2022-12-12 NOTE — Assessment & Plan Note (Signed)
Tolerating medicine well until this week. Will continue and recheck 2 months.

## 2022-12-13 LAB — COMPREHENSIVE METABOLIC PANEL
ALT: 43 IU/L (ref 0–44)
AST: 33 IU/L (ref 0–40)
Albumin/Globulin Ratio: 1.4 (ref 1.2–2.2)
Albumin: 4.5 g/dL (ref 3.9–4.9)
Alkaline Phosphatase: 166 IU/L — ABNORMAL HIGH (ref 44–121)
BUN/Creatinine Ratio: 18 (ref 10–24)
BUN: 17 mg/dL (ref 8–27)
Bilirubin Total: 0.5 mg/dL (ref 0.0–1.2)
CO2: 18 mmol/L — ABNORMAL LOW (ref 20–29)
Calcium: 9.5 mg/dL (ref 8.6–10.2)
Chloride: 100 mmol/L (ref 96–106)
Creatinine, Ser: 0.93 mg/dL (ref 0.76–1.27)
Globulin, Total: 3.3 g/dL (ref 1.5–4.5)
Glucose: 140 mg/dL — ABNORMAL HIGH (ref 70–99)
Potassium: 3.5 mmol/L (ref 3.5–5.2)
Sodium: 140 mmol/L (ref 134–144)
Total Protein: 7.8 g/dL (ref 6.0–8.5)
eGFR: 93 mL/min/{1.73_m2} (ref >59)

## 2022-12-13 LAB — LIPASE: Lipase: 59 U/L (ref 13–78)

## 2022-12-13 LAB — CBC WITH DIFFERENTIAL/PLATELET
Basophils Absolute: 0 10*3/uL (ref 0.0–0.2)
Basos: 1 %
EOS (ABSOLUTE): 0.8 10*3/uL — ABNORMAL HIGH (ref 0.0–0.4)
Eos: 10 %
Hematocrit: 50.7 % (ref 37.5–51.0)
Hemoglobin: 17.4 g/dL (ref 13.0–17.7)
Immature Grans (Abs): 0 10*3/uL (ref 0.0–0.1)
Immature Granulocytes: 0 %
Lymphocytes Absolute: 2 10*3/uL (ref 0.7–3.1)
Lymphs: 27 %
MCH: 29.2 pg (ref 26.6–33.0)
MCHC: 34.3 g/dL (ref 31.5–35.7)
MCV: 85 fL (ref 79–97)
Monocytes Absolute: 0.6 10*3/uL (ref 0.1–0.9)
Monocytes: 9 %
Neutrophils Absolute: 4 10*3/uL (ref 1.4–7.0)
Neutrophils: 53 %
Platelets: 261 10*3/uL (ref 150–450)
RBC: 5.96 x10E6/uL — ABNORMAL HIGH (ref 4.14–5.80)
RDW: 12.6 % (ref 11.6–15.4)
WBC: 7.4 10*3/uL (ref 3.4–10.8)

## 2022-12-13 LAB — AMYLASE: Amylase: 68 U/L (ref 31–110)

## 2022-12-20 ENCOUNTER — Other Ambulatory Visit: Payer: Self-pay | Admitting: Family Medicine

## 2022-12-23 NOTE — Telephone Encounter (Signed)
Requested medication (s) are due for refill today: no  Requested medication (s) are on the active medication list: yes  Last refill:  01/12/23-02/11/23 #2 ml 0 refills  Future visit scheduled: yes in 1 month  Notes to clinic:  medication not assigned to a protocol. Do you want to refill Rx?     Requested Prescriptions  Pending Prescriptions Disp Refills   MOUNJARO 10 MG/0.5ML Pen [Pharmacy Med Name: MOUNJARO 10 MG/0.5 ML PEN[$R]] 2 mL 0    Sig: INJECT '10MG'$  UNDER THE SKIN ONCE A WEEK     Off-Protocol Failed - 12/20/2022 12:11 PM      Failed - Medication not assigned to a protocol, review manually.      Passed - Valid encounter within last 12 months    Recent Outpatient Visits           1 week ago Diarrhea, unspecified type   [place], [name], DO   1 month ago Uncontrolled type 2 diabetes mellitus with hyperglycemia ([place])   [place], [name], DO   3 months ago Uncontrolled type 2 diabetes mellitus with hyperglycemia ([place])   [place], [name], PA-C   4 months ago Hyperlipidemia, unspecified hyperlipidemia type   [place] [facility] Mecum, [name], PA-C   7 months ago Type 2 diabetes mellitus without complication, without long-term current use of insulin ([place])   [place], MD       Future Appointments             In 1 month [name], Barb Merino, DO [place]

## 2023-01-31 ENCOUNTER — Other Ambulatory Visit: Payer: Self-pay | Admitting: Family Medicine

## 2023-02-03 NOTE — Telephone Encounter (Signed)
Requested Prescriptions  Pending Prescriptions Disp Refills   MOUNJARO 7.5 MG/0.5ML Pen [Pharmacy Med Name: MOUNJARO 7.5 MG/0.5 ML PEN[$R]] 2 mL 1    Sig: INJECT 7.'5MG'$  UNDER THE SKIN ONCE A WEEK     Off-Protocol Failed - 01/31/2023  8:48 PM      Failed - Medication not assigned to a protocol, review manually.      Passed - Valid encounter within last 12 months    Recent Outpatient Visits           1 month ago Diarrhea, unspecified type   [place], [place] P, DO   2 months ago Uncontrolled type 2 diabetes mellitus with hyperglycemia ([place])   [place], [name], DO   5 months ago Uncontrolled type 2 diabetes mellitus with hyperglycemia ([place])   [place], [name], PA-C   5 months ago Hyperlipidemia, unspecified hyperlipidemia type   [place] [facility] Mecum, [name], PA-C   8 months ago Type 2 diabetes mellitus without complication, without long-term current use of insulin ([place])   [place] Vigg, Avanti, MD       Future Appointments             In 1 week [place], Barb Merino, DO [place], [facility]

## 2023-02-12 ENCOUNTER — Ambulatory Visit: Payer: Medicaid Other | Admitting: Family Medicine

## 2023-02-12 ENCOUNTER — Other Ambulatory Visit: Payer: Self-pay

## 2023-02-12 ENCOUNTER — Encounter: Payer: Self-pay | Admitting: Family Medicine

## 2023-02-12 VITALS — BP 104/68 | HR 66 | Temp 97.9°F | Ht 66.0 in | Wt 180.9 lb

## 2023-02-12 DIAGNOSIS — E1165 Type 2 diabetes mellitus with hyperglycemia: Secondary | ICD-10-CM

## 2023-02-12 DIAGNOSIS — Z136 Encounter for screening for cardiovascular disorders: Secondary | ICD-10-CM | POA: Diagnosis not present

## 2023-02-12 DIAGNOSIS — E119 Type 2 diabetes mellitus without complications: Secondary | ICD-10-CM

## 2023-02-12 DIAGNOSIS — E785 Hyperlipidemia, unspecified: Secondary | ICD-10-CM | POA: Diagnosis not present

## 2023-02-12 DIAGNOSIS — I10 Essential (primary) hypertension: Secondary | ICD-10-CM | POA: Diagnosis not present

## 2023-02-12 LAB — BAYER DCA HB A1C WAIVED: HB A1C (BAYER DCA - WAIVED): 6.6 % — ABNORMAL HIGH (ref 4.8–5.6)

## 2023-02-12 MED ORDER — GABAPENTIN 100 MG PO CAPS: 100.0000 mg | ORAL_CAPSULE | Freq: Every day | ORAL | 1 refills | Status: DC

## 2023-02-12 MED ORDER — METFORMIN HCL 1000 MG PO TABS: 1000.0000 mg | ORAL_TABLET | Freq: Two times a day (BID) | ORAL | 1 refills | Status: DC

## 2023-02-12 MED ORDER — ACCU-CHEK AVIVA PLUS W/DEVICE KIT: 1.0000 | PACK | Freq: Every day | 0 refills | Status: DC

## 2023-02-12 MED ORDER — ACCU-CHEK AVIVA PLUS VI STRP: ORAL_STRIP | 12 refills | Status: DC

## 2023-02-12 MED ORDER — ATORVASTATIN CALCIUM 40 MG PO TABS: 40.0000 mg | ORAL_TABLET | Freq: Every day | ORAL | 1 refills | Status: DC

## 2023-02-12 MED ORDER — AMLODIPINE BESYLATE 5 MG PO TABS: 5.0000 mg | ORAL_TABLET | Freq: Every day | ORAL | 1 refills | Status: DC

## 2023-02-12 MED ORDER — MOUNJARO 7.5 MG/0.5ML ~~LOC~~ SOAJ: SUBCUTANEOUS | 1 refills | Status: DC

## 2023-02-12 MED ORDER — LOSARTAN POTASSIUM-HCTZ 100-25 MG PO TABS: 1.0000 | ORAL_TABLET | Freq: Every day | ORAL | 1 refills | Status: DC

## 2023-02-12 MED ORDER — EMPAGLIFLOZIN 25 MG PO TABS: 25.0000 mg | ORAL_TABLET | Freq: Every day | ORAL | 1 refills | Status: DC

## 2023-02-12 NOTE — Assessment & Plan Note (Signed)
Under good control on current regimen. Continue current regimen. Continue to monitor. Call with any concerns. Refills given. Labs drawn today.   

## 2023-02-12 NOTE — Assessment & Plan Note (Signed)
Doing so much better with A1c down to 6.6 from 9.4. Continue current regimen. Continue to monitor. Recheck 6 months. Call with any concerns.

## 2023-02-12 NOTE — Progress Notes (Signed)
BP 104/68   Pulse 66   Temp 97.9 F (36.6 C) (Oral)   Ht '5\' 6"'$  (1.676 m)   Wt 180 lb 14.4 oz (82.1 kg)   SpO2 99%   BMI 29.20 kg/m    Subjective:    Patient ID: Devin Arias, male    DOB: 05-18-1961, 62 y.o.   MRN: [id number]  HPI: Devin Arias is a 62 y.o. male  Chief Complaint  Patient presents with   Diabetes   DIABETES Hypoglycemic episodes:no Polydipsia/polyuria: no Visual disturbance: no Chest pain: no Paresthesias: no Glucose Monitoring: yes  Accucheck frequency: Daily Taking Insulin?: no Blood Pressure Monitoring: not checking Retinal Examination: Up to Date Foot Exam: Up to Date Diabetic Education: Completed Pneumovax: Up to Date Influenza: Up to Date Aspirin: no  HYPERTENSION / [place] Satisfied with current treatment? yes Duration of hypertension: chronic BP monitoring frequency: not checking BP medication side effects: no Past BP meds: losartan, HCTZ, amlodipine Duration of hyperlipidemia: chronic Cholesterol medication side effects: no Cholesterol supplements: none Past cholesterol medications: atorvastatin Medication compliance: excellent compliance Aspirin: no Recent stressors: no Recurrent headaches: no Visual changes: no Palpitations: no Dyspnea: no Chest pain: no Lower extremity edema: no Dizzy/lightheaded: no   Relevant past medical, surgical, family and social history reviewed and updated as indicated. Interim medical history since our last visit reviewed. Allergies and medications reviewed and updated.  Review of Systems  Constitutional: Negative.   Respiratory: Negative.    Cardiovascular: Negative.   Gastrointestinal: Negative.   Musculoskeletal: Negative.   Psychiatric/Behavioral: Negative.      Per HPI unless specifically indicated above     Objective:    BP 104/68   Pulse 66   Temp 97.9 F (36.6 C) (Oral)   Ht '5\' 6"'$  (1.676 m)   Wt 180 lb 14.4 oz (82.1 kg)   SpO2 99%   BMI 29.20 kg/m    Wt Readings from Last 3 Encounters:  02/12/23 180 lb 14.4 oz (82.1 kg)  12/12/22 180 lb 6.4 oz (81.8 kg)  11/13/22 189 lb 1.6 oz (85.8 kg)    Physical Exam Vitals and nursing note reviewed.  Constitutional:      General: He is not in acute distress.    Appearance: Normal appearance. He is not ill-appearing, toxic-appearing or diaphoretic.  HENT:     Head: Normocephalic and atraumatic.     Right Ear: External ear normal.     Left Ear: External ear normal.     Nose: Nose normal.     Mouth/Throat:     Mouth: Mucous membranes are moist.     Pharynx: Oropharynx is clear.  Eyes:     General: No scleral icterus.       Right eye: No discharge.        Left eye: No discharge.     Extraocular Movements: Extraocular movements intact.     Conjunctiva/sclera: Conjunctivae normal.     Pupils: Pupils are equal, round, and reactive to light.  Cardiovascular:     Rate and Rhythm: Normal rate and regular rhythm.     Pulses: Normal pulses.     Heart sounds: Normal heart sounds. No murmur heard.    No friction rub. No gallop.  Pulmonary:     Effort: Pulmonary effort is normal. No respiratory distress.     Breath sounds: Normal breath sounds. No stridor. No wheezing, rhonchi or rales.  Chest:     Chest wall: No tenderness.  Musculoskeletal:  General: Normal range of motion.     Cervical back: Normal range of motion and neck supple.  Skin:    General: Skin is warm and dry.     Capillary Refill: Capillary refill takes less than 2 seconds.     Coloration: Skin is not jaundiced or pale.     Findings: No bruising, erythema, lesion or rash.  Neurological:     General: No focal deficit present.     Mental Status: He is alert and oriented to person, place, and time. Mental status is at baseline.  Psychiatric:        Mood and Affect: Mood normal.        Behavior: Behavior normal.        Thought Content: Thought content normal.        Judgment: Judgment normal.     Results for orders  placed or performed in visit on 12/12/22  Comprehensive metabolic panel  Result Value Ref Range   Glucose 140 (H) 70 - 99 mg/dL   BUN 17 8 - 27 mg/dL   Creatinine, Ser 0.93 0.76 - 1.27 mg/dL   eGFR 93 >59 mL/min/1.73   BUN/Creatinine Ratio 18 10 - 24   Sodium 140 134 - 144 mmol/L   Potassium 3.5 3.5 - 5.2 mmol/L   Chloride 100 96 - 106 mmol/L   CO2 18 (L) 20 - 29 mmol/L   Calcium 9.5 8.6 - 10.2 mg/dL   Total Protein 7.8 6.0 - 8.5 g/dL   Albumin 4.5 3.9 - 4.9 g/dL   Globulin, Total 3.3 1.5 - 4.5 g/dL   Albumin/Globulin Ratio 1.4 1.2 - 2.2   Bilirubin Total 0.5 0.0 - 1.2 mg/dL   Alkaline Phosphatase 166 (H) 44 - 121 IU/L   AST 33 0 - 40 IU/L   ALT 43 0 - 44 IU/L  Amylase  Result Value Ref Range   Amylase 68 31 - 110 U/L  Lipase  Result Value Ref Range   Lipase 59 13 - 78 U/L  Urinalysis, Routine w reflex microscopic  Result Value Ref Range   Specific Gravity, UA 1.015 1.005 - 1.030   pH, UA 5.0 5.0 - 7.5   Color, UA Yellow Yellow   Appearance Ur Clear Clear   Leukocytes,UA Negative Negative   Protein,UA Negative Negative/Trace   Glucose, UA 2+ (A) Negative   Ketones, UA Negative Negative   RBC, UA Negative Negative   Bilirubin, UA Negative Negative   Urobilinogen, Ur 0.2 0.2 - 1.0 mg/dL   Nitrite, UA Negative Negative   Microscopic Examination Comment   CBC with Differential/Platelet  Result Value Ref Range   WBC 7.4 3.4 - 10.8 x10E3/uL   RBC 5.96 (H) 4.14 - 5.80 x10E6/uL   Hemoglobin 17.4 13.0 - 17.7 g/dL   Hematocrit 50.7 37.5 - 51.0 %   MCV 85 79 - 97 fL   MCH 29.2 26.6 - 33.0 pg   MCHC 34.3 31.5 - 35.7 g/dL   RDW 12.6 11.6 - 15.4 %   Platelets 261 150 - 450 x10E3/uL   Neutrophils 53 Not Estab. %   Lymphs 27 Not Estab. %   Monocytes 9 Not Estab. %   Eos 10 Not Estab. %   Basos 1 Not Estab. %   Neutrophils Absolute 4.0 1.4 - 7.0 x10E3/uL   Lymphocytes Absolute 2.0 0.7 - 3.1 x10E3/uL   Monocytes Absolute 0.6 0.1 - 0.9 x10E3/uL   EOS (ABSOLUTE) 0.8 (H) 0.0 -  0.4 x10E3/uL   Basophils  Absolute 0.0 0.0 - 0.2 x10E3/uL   Immature Granulocytes 0 Not Estab. %   Immature Grans (Abs) 0.0 0.0 - 0.1 x10E3/uL      Assessment & Plan:   Problem List Items Addressed This Visit       Cardiovascular and Mediastinum   Primary hypertension    Under good control on current regimen. Continue current regimen. Continue to monitor. Call with any concerns. Refills given. Labs drawn today.        Relevant Medications   amLODipine (NORVASC) 5 MG tablet   atorvastatin (LIPITOR) 40 MG tablet   losartan-hydrochlorothiazide (HYZAAR) 100-25 MG tablet   Other Relevant Orders   Ambulatory referral to Cardiology     Endocrine   Uncontrolled type 2 diabetes mellitus with hyperglycemia ([place]) - Primary    Doing so much better with A1c down to 6.6 from 9.4. Continue current regimen. Continue to monitor. Recheck 6 months. Call with any concerns.       Relevant Medications   tirzepatide (MOUNJARO) 7.5 MG/0.5ML Pen   atorvastatin (LIPITOR) 40 MG tablet   empagliflozin (JARDIANCE) 25 MG TABS tablet   losartan-hydrochlorothiazide (HYZAAR) 100-25 MG tablet   metFORMIN (GLUCOPHAGE) 1000 MG tablet   Other Relevant Orders   CBC with Differential/Platelet   Bayer DCA Hb A1c Waived   Lipid Panel w/o Chol/HDL Ratio   Comprehensive metabolic panel   Ambulatory referral to Cardiology     Other   Hyperlipidemia    Under good control on current regimen. Continue current regimen. Continue to monitor. Call with any concerns. Refills given. Labs drawn today.       Relevant Medications   amLODipine (NORVASC) 5 MG tablet   atorvastatin (LIPITOR) 40 MG tablet   losartan-hydrochlorothiazide (HYZAAR) 100-25 MG tablet   Other Relevant Orders   Ambulatory referral to Cardiology   Other Visit Diagnoses     Screening for cardiovascular condition       Saw cardiology many years ago. Has not seen them in years. Would like to go back. Referral generated today.   Relevant Orders    Ambulatory referral to Cardiology        Follow up plan: Return in about 6 months (around 08/15/2023) for physical.

## 2023-02-13 ENCOUNTER — Encounter: Payer: Self-pay | Admitting: Family Medicine

## 2023-02-13 LAB — COMPREHENSIVE METABOLIC PANEL
ALT: 32 IU/L (ref 0–44)
AST: 21 IU/L (ref 0–40)
Albumin/Globulin Ratio: 1.5 (ref 1.2–2.2)
Albumin: 4.5 g/dL (ref 3.9–4.9)
Alkaline Phosphatase: 105 IU/L (ref 44–121)
BUN/Creatinine Ratio: 14 (ref 10–24)
BUN: 12 mg/dL (ref 8–27)
Bilirubin Total: 0.6 mg/dL (ref 0.0–1.2)
CO2: 25 mmol/L (ref 20–29)
Calcium: 9.3 mg/dL (ref 8.6–10.2)
Chloride: 101 mmol/L (ref 96–106)
Creatinine, Ser: 0.83 mg/dL (ref 0.76–1.27)
Globulin, Total: 3 g/dL (ref 1.5–4.5)
Glucose: 109 mg/dL — ABNORMAL HIGH (ref 70–99)
Potassium: 4.1 mmol/L (ref 3.5–5.2)
Sodium: 141 mmol/L (ref 134–144)
Total Protein: 7.5 g/dL (ref 6.0–8.5)
eGFR: 100 mL/min/{1.73_m2} (ref >59)

## 2023-02-13 LAB — LIPID PANEL W/O CHOL/HDL RATIO
Cholesterol, Total: 112 mg/dL (ref 100–199)
HDL: 49 mg/dL (ref >39)
LDL Chol Calc (NIH): 46 mg/dL (ref 0–99)
Triglycerides: 89 mg/dL (ref 0–149)
VLDL Cholesterol Cal: 17 mg/dL (ref 5–40)

## 2023-02-13 LAB — CBC WITH DIFFERENTIAL/PLATELET
Basophils Absolute: 0 10*3/uL (ref 0.0–0.2)
Basos: 1 %
EOS (ABSOLUTE): 0.3 10*3/uL (ref 0.0–0.4)
Eos: 4 %
Hematocrit: 48.5 % (ref 37.5–51.0)
Hemoglobin: 16.3 g/dL (ref 13.0–17.7)
Immature Grans (Abs): 0 10*3/uL (ref 0.0–0.1)
Immature Granulocytes: 0 %
Lymphocytes Absolute: 2.1 10*3/uL (ref 0.7–3.1)
Lymphs: 33 %
MCH: 29.5 pg (ref 26.6–33.0)
MCHC: 33.6 g/dL (ref 31.5–35.7)
MCV: 88 fL (ref 79–97)
Monocytes Absolute: 0.5 10*3/uL (ref 0.1–0.9)
Monocytes: 8 %
Neutrophils Absolute: 3.5 10*3/uL (ref 1.4–7.0)
Neutrophils: 54 %
Platelets: 223 10*3/uL (ref 150–450)
RBC: 5.52 x10E6/uL (ref 4.14–5.80)
RDW: 13.6 % (ref 11.6–15.4)
WBC: 6.4 10*3/uL (ref 3.4–10.8)

## 2023-02-20 ENCOUNTER — Telehealth: Payer: Self-pay | Admitting: Family Medicine

## 2023-02-20 NOTE — Telephone Encounter (Signed)
Spoke with patient to clarify how often he is using his glucose testing strips to check his sugars. Patient says he is using them twice a day. Called [facility] to provide them with the clarified information. Pharmacy technician verbalized understanding.

## 2023-02-20 NOTE — Telephone Encounter (Signed)
The pharmacy called requesting to know how many times the patient is supposed to use his strips in one day, they need to know for auditing purposes. The instructions currently say use as instructed.   glucose blood (ACCU-CHEK AVIVA PLUS) test strip   Helyn App, [facility] in [place], [place] [facility] AT [address]  [place] [address]  Phone: [phone] Fax: [phone]

## 2023-04-07 ENCOUNTER — Other Ambulatory Visit: Payer: Self-pay | Admitting: Nurse Practitioner

## 2023-04-08 NOTE — Telephone Encounter (Signed)
Requested Prescriptions  Pending Prescriptions Disp Refills   JARDIANCE 25 MG TABS tablet [Pharmacy Med Name: JARDIANCE 25 MG TAB[*]] 90 tablet 1    Sig: TAKE ONE TABLET BY MOUTH ONE TIME DAILY BEFORE BREAKFAST     Endocrinology:  Diabetes - SGLT2 Inhibitors Passed - 04/07/2023  6:43 PM      Passed - Cr in normal range and within 360 days    Creatinine, Ser  Date Value Ref Range Status  02/12/2023 0.83 0.76 - 1.27 mg/dL Final         Passed - HBA1C is between 0 and 7.9 and within 180 days    HB A1C (BAYER DCA - WAIVED)  Date Value Ref Range Status  02/12/2023 6.6 (H) 4.8 - 5.6 % Final    Comment:             Prediabetes: 5.7 - 6.4          Diabetes: >6.4          Glycemic control for adults with diabetes: <7.0          Passed - eGFR in normal range and within 360 days    eGFR  Date Value Ref Range Status  02/12/2023 100 >59 mL/min/1.73 Final         Passed - Valid encounter within last 6 months    Recent Outpatient Visits           1 month ago Uncontrolled type 2 diabetes mellitus with hyperglycemia (HCC)   [place] [facility] [place], [name], DO   3 months ago Diarrhea, unspecified type   [place] [facility] [place], [name], DO   4 months ago Uncontrolled type 2 diabetes mellitus with hyperglycemia (HCC)   [place] [facility] [place], [name], DO   7 months ago Uncontrolled type 2 diabetes mellitus with hyperglycemia (HCC)   [place] [facility] Mecum, [name], PA-C   7 months ago Hyperlipidemia, unspecified hyperlipidemia type   [place] [facility] Mecum, [name], PA-C       Future Appointments             In 1 week Agbor-Etang, Arlys John, MD [facility] at [place]   In 4 months [name], DO [place] [facility], [facility]

## 2023-04-11 ENCOUNTER — Other Ambulatory Visit: Payer: Self-pay | Admitting: Nurse Practitioner

## 2023-04-11 DIAGNOSIS — E785 Hyperlipidemia, unspecified: Secondary | ICD-10-CM

## 2023-04-11 NOTE — Telephone Encounter (Signed)
Unable to refill per protocol, Rx request is too soon. Last refill 02/12/23 for 90 and 1 refill.  Requested Prescriptions  Pending Prescriptions Disp Refills   atorvastatin (LIPITOR) 40 MG tablet [Pharmacy Med Name: ATORVASTATIN 40 MG TAB] 30 tablet 2    Sig: TAKE ONE TABLET BY MOUTH ONE TIME DAILY     Cardiovascular:  Antilipid - Statins Failed - 04/11/2023  3:17 PM      Failed - Lipid Panel in normal range within the last 12 months    Cholesterol, Total  Date Value Ref Range Status  02/12/2023 112 100 - 199 mg/dL Final   LDL Chol Calc (NIH)  Date Value Ref Range Status  02/12/2023 46 0 - 99 mg/dL Final   HDL  Date Value Ref Range Status  02/12/2023 49 >39 mg/dL Final   Triglycerides  Date Value Ref Range Status  02/12/2023 89 0 - 149 mg/dL Final         Passed - Patient is not pregnant      Passed - Valid encounter within last 12 months    Recent Outpatient Visits           1 month ago Uncontrolled type 2 diabetes mellitus with hyperglycemia (HCC)   [place] [facility] [place], [name], DO   4 months ago Diarrhea, unspecified type   [place] [facility] [place], [name], DO   4 months ago Uncontrolled type 2 diabetes mellitus with hyperglycemia (HCC)   [place] [facility] [place], [name], DO   7 months ago Uncontrolled type 2 diabetes mellitus with hyperglycemia (HCC)   [place] [facility] Mecum, [name], PA-C   8 months ago Hyperlipidemia, unspecified hyperlipidemia type   [place] [facility] Mecum, [name], PA-C       Future Appointments             In 4 days Agbor-Etang, Arlys John, MD [facility] at [place]   In 4 months [name], DO  [facility], [facility]

## 2023-04-15 ENCOUNTER — Ambulatory Visit: Payer: Medicaid Other | Attending: Cardiology | Admitting: Cardiology

## 2023-04-15 ENCOUNTER — Encounter: Payer: Self-pay | Admitting: Cardiology

## 2023-04-15 VITALS — BP 108/60 | HR 69 | Ht 67.0 in | Wt 187.5 lb

## 2023-04-15 DIAGNOSIS — Z7189 Other specified counseling: Secondary | ICD-10-CM | POA: Diagnosis not present

## 2023-04-15 DIAGNOSIS — E7800 Pure hypercholesterolemia, unspecified: Secondary | ICD-10-CM

## 2023-04-15 DIAGNOSIS — I10 Essential (primary) hypertension: Secondary | ICD-10-CM | POA: Diagnosis not present

## 2023-04-15 DIAGNOSIS — R9431 Abnormal electrocardiogram [ECG] [EKG]: Secondary | ICD-10-CM

## 2023-04-15 NOTE — Patient Instructions (Signed)
Medication Instructions:   Your physician recommends that you continue on your current medications as directed. Please refer to the Current Medication list given to you today.  *If you need a refill on your cardiac medications before your next appointment, please call your pharmacy*   Lab Work:  None Ordered  If you have labs (blood work) drawn today and your tests are completely normal, you will receive your results only by: MyChart Message (if you have MyChart) OR A paper copy in the mail If you have any lab test that is abnormal or we need to change your treatment, we will call you to review the results.   Testing/Procedures:  Your physician has requested that you have an echocardiogram. Echocardiography is a painless test that uses sound waves to create images of your heart. It provides your doctor with information about the size and shape of your heart and how well your heart's chambers and valves are working. This procedure takes approximately one hour. There are no restrictions for this procedure. Please do NOT wear cologne, perfume, aftershave, or lotions (deodorant is allowed). Please arrive 15 minutes prior to your appointment time.  2. We will order CT coronary calcium score $99 at our [facility] in [place]  Please call Judeth Cornfield @ [phone] to schedule   Outpatient Imaging Center [address] [place], [place] [address]   Follow-Up: At [facility], you and your health needs are our priority.  As part of our continuing mission to provide you with exceptional heart care, we have created designated Provider Care Teams.  These Care Teams include your primary Cardiologist (physician) and Advanced Practice Providers (APPs -  Physician Assistants and Nurse Practitioners) who all work together to provide you with the care you need, when you need it.  We recommend signing up for the patient portal called "MyChart".  Sign up  information is provided on this After Visit Summary.  MyChart is used to connect with patients for Virtual Visits (Telemedicine).  Patients are able to view lab/test results, encounter notes, upcoming appointments, etc.  Non-urgent messages can be sent to your provider as well.   To learn more about what you can do with MyChart, go to [email or web address].    Your next appointment:    After Echocardiogram  Provider:   You may see Debbe Odea, MD or one of the following Advanced Practice Providers on your designated Care Team:   Nicolasa Ducking, NP Eula Listen, PA-C Cadence Fransico Michael, PA-C Charlsie Quest, NP

## 2023-04-15 NOTE — Progress Notes (Signed)
Cardiology Office Note:    Date:  04/15/2023   ID:  Devin Arias, DOB 11/08/1961, MRN [id number]  PCP:  Devin Carrow, DO    HeartCare Providers Cardiologist:  Devin Odea, MD     Referring MD: Devin Carrow, DO   Chief Complaint  Patient presents with   New Patient (Initial Visit)    HLD/HTN no complaints today. Meds reviewed verbally with pt.    History of Present Illness:    Devin Arias is a 62 y.o. male with a hx of hypertension, hyperlipidemia, diabetes who presents for cardiac risk stratification.  Patient denies chest pain, shortness of breath, dizziness, presyncope or syncope.  Denies any family history of heart disease.  He is compliant with medications for blood pressure and cholesterol as prescribed.  Blood pressure and cholesterol is adequately controlled.  He feels well, no concerns at this time.  Past Medical History:  Diagnosis Date   Diabetes mellitus without complication (HCC)    Hypertension     Past Surgical History:  Procedure Laterality Date   CHOLECYSTECTOMY     COLONOSCOPY WITH PROPOFOL N/A 08/02/2021   Procedure: COLONOSCOPY WITH PROPOFOL;  Surgeon: Devin Mood, MD;  Location[facility] END[facility]COPY;  Service: Gastroenterology;  Laterality: N/A;  PATIENT SAYS HE DOES NOT INTERPRETER    Current Medications: No outpatient medications have been marked as taking for the 04/15/23 encounter (Office Visit) with Devin Odea, MD.     Allergies:   Patient has no known allergies.   Social History   Socioeconomic History   Marital status: Married    Spouse name: Not on file   Number of children: Not on file   Years of education: Not on file   Highest education level: Not on file  Occupational History   Not on file  Tobacco Use   Smoking status: Never   Smokeless tobacco: Never  Vaping Use   Vaping Use: Never used  Substance and Sexual Activity   Alcohol use: Never   Drug use: Never   Sexual activity: Yes     Partners: Female  Other Topics Concern   Not on file  Social History Narrative   Not on file   Social Determinants of Health   Financial Resource Strain: Not on file  Food Insecurity: Not on file  Transportation Needs: Not on file  Physical Activity: Not on file  Stress: Not on file  Social Connections: Not on file     Family History: The patient's family history is not on file.  ROS:   Please see the history of present illness.     All other systems reviewed and are negative.  EKGs/Labs/Other Studies Reviewed:    The following studies were reviewed today:   EKG:  EKG is  ordered today.  The ekg ordered today demonstrates normal sinus rhythm, heart rate 69, possible old septal infarct  Recent Labs: 08/14/2022: TSH 1.540 02/12/2023: ALT 32; BUN 12; Creatinine, Ser 0.83; Hemoglobin 16.3; Platelets 223; Potassium 4.1; Sodium 141  Recent Lipid Panel    Component Value Date/Time   CHOL 112 02/12/2023 0853   TRIG 89 02/12/2023 0853   HDL 49 02/12/2023 0853   CHOLHDL 2.5 05/15/2022 0829   LDLCALC 46 02/12/2023 0853     Risk Assessment/Calculations:             Physical Exam:    VS:  BP 108/60 (BP Location: Right Arm, Patient Position: Sitting, Cuff Size: Normal)   Pulse 69  Ht 5\' 7"  (1.702 m)   Wt 187 lb 8 oz (85 kg)   SpO2 99%   BMI 29.37 kg/m     Wt Readings from Last 3 Encounters:  04/15/23 187 lb 8 oz (85 kg)  02/12/23 180 lb 14.4 oz (82.1 kg)  12/12/22 180 lb 6.4 oz (81.8 kg)     GEN:  Well nourished, well developed in no acute distress HEENT: Normal NECK: No JVD; No carotid bruits CARDIAC: RRR, no murmurs, rubs, gallops RESPIRATORY:  Clear to auscultation without rales, wheezing or rhonchi  ABDOMEN: Soft, non-tender, non-distended MUSCULOSKELETAL:  No edema; No deformity  SKIN: Warm and dry NEUROLOGIC:  Alert and oriented x 3 PSYCHIATRIC:  Normal affect   ASSESSMENT:    1. Abnormal EKG   2. Cardiac risk counseling   3. Primary hypertension    4. 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  Get echocardiogram to rule out any structural or wall motion abnormalities. Cardiac risk stratification, patient is asymptomatic.  Get coronary calcium score. Hypertension, BP controlled.  Continue Norvasc 5 mg daily, Hyzaar. Hyperlipidemia, cholesterol controlled.  Continue Lipitor 40 mg daily.  Follow-up after echo and calcium score.      Medication Adjustments/Labs and Tests Ordered: Current medicines are reviewed at length with the patient today.  Concerns regarding medicines are outlined above.  Orders Placed This Encounter  Procedures   CT CARDIAC SCORING   EKG 12-Lead   ECHOCARDIOGRAM COMPLETE   No orders of the defined types were placed in this encounter.   Patient Instructions  Medication Instructions:   Your physician recommends that you continue on your current medications as directed. Please refer to the Current Medication list given to you today.  *If you need a refill on your cardiac medications before your next appointment, please call your pharmacy*   Lab Work:  None Ordered  If you have labs (blood work) drawn today and your tests are completely normal, you will receive your results only by: MyChart Message (if you have MyChart) OR A paper copy in the mail If you have any lab test that is abnormal or we need to change your treatment, we will call you to review the results.   Testing/Procedures:  Your physician has requested that you have an echocardiogram. Echocardiography is a painless test that uses sound waves to create images of your heart. It provides your doctor with information about the size and shape of your heart and how well your heart's chambers and valves are working. This procedure takes approximately one hour. There are no restrictions for this procedure. Please do NOT wear cologne,  perfume, aftershave, or lotions (deodorant is allowed). Please arrive 15 minutes prior to your appointment time.  2. We will order CT coronary calcium score $99 at our [facility] in [place]  Please call Judeth Cornfield @ [phone] to schedule   Outpatient Imaging Center [address] [place], [place] [address]   Follow-Up: At [facility], you and your health needs are our priority.  As part of our continuing mission to provide you with exceptional heart care, we have created designated Provider Care Teams.  These Care Teams include your primary Cardiologist (physician) and Advanced Practice Providers (APPs -  Physician Assistants and Nurse Practitioners) who all work together to provide you with the care you need, when you need it.  We recommend signing up for the patient portal called "MyChart".  Sign up information is provided on this After Visit Summary.  MyChart is used to connect with patients for Virtual Visits (Telemedicine).  Patients are able to view lab/test results, encounter notes, upcoming appointments, etc.  Non-urgent messages can be sent to your provider as well.   To learn more about what you can do with MyChart, go to [email or web address].    Your next appointment:    After Echocardiogram  Provider:   You may see Devin Odea, MD or one of the following Advanced Practice Providers on your designated Care Team:   Nicolasa Ducking, NP Eula Listen, PA-C Cadence Fransico Michael, PA-C Charlsie Quest, NP   Signed, Devin Odea, MD  04/15/2023 10:04 AM     HeartCare

## 2023-04-21 ENCOUNTER — Ambulatory Visit (LOCAL_COMMUNITY_HEALTH_CENTER): Payer: Medicaid Other

## 2023-04-21 DIAGNOSIS — Z23 Encounter for immunization: Secondary | ICD-10-CM | POA: Diagnosis not present

## 2023-04-21 DIAGNOSIS — Z719 Counseling, unspecified: Secondary | ICD-10-CM

## 2023-04-21 NOTE — Progress Notes (Signed)
Patient seen in nurse clinic for COVID vaccine. VIS provided. Spikevax 2023-24 +12Y/ACHD IM left deltoid.  Tolerated well. COVID card updated. NCIR updated and 2 copies provided. Waited 10 minutes.

## 2023-04-22 ENCOUNTER — Other Ambulatory Visit: Payer: Self-pay | Admitting: Family Medicine

## 2023-04-22 NOTE — Telephone Encounter (Signed)
Requested by interface surescripts. Future visit in 3 months.  Requested Prescriptions  Pending Prescriptions Disp Refills   amLODipine (NORVASC) 5 MG tablet [Pharmacy Med Name: AMLODIPINE 5 MG TAB[*]] 90 tablet 0    Sig: TAKE ONE TABLET BY MOUTH ONE TIME DAILY     Cardiovascular: Calcium Channel Blockers 2 Passed - 04/22/2023  9:29 AM      Passed - Last BP in normal range    BP Readings from Last 1 Encounters:  04/15/23 108/60         Passed - Last Heart Rate in normal range    Pulse Readings from Last 1 Encounters:  04/15/23 69         Passed - Valid encounter within last 6 months    Recent Outpatient Visits           2 months ago Uncontrolled type 2 diabetes mellitus with hyperglycemia (HCC)   [place] [facility] [place], [name], DO   4 months ago Diarrhea, unspecified type   [place] [facility] [place], [name], DO   5 months ago Uncontrolled type 2 diabetes mellitus with hyperglycemia (HCC)   [place] [facility] [place], [name], DO   7 months ago Uncontrolled type 2 diabetes mellitus with hyperglycemia (HCC)   [place] [facility] Mecum, [name], PA-C   8 months ago Hyperlipidemia, unspecified hyperlipidemia type   [place] [facility] Mecum, [name], PA-C       Future Appointments             In 2 months Agbor-Etang, Arlys John, MD [facility] at [place]   In 3 months [name], DO [place] [facility], [facility]

## 2023-04-24 ENCOUNTER — Telehealth: Payer: Self-pay

## 2023-04-24 ENCOUNTER — Other Ambulatory Visit: Payer: Self-pay | Admitting: Family Medicine

## 2023-04-24 MED ORDER — EMPAGLIFLOZIN 25 MG PO TABS: 25.0000 mg | ORAL_TABLET | Freq: Every day | ORAL | 1 refills | Status: DC

## 2023-04-24 NOTE — Telephone Encounter (Signed)
Patient states that rx for empagliflozin (JARDIANCE) 25 MG TABS tablet that was sent to [facility] on 3/13 was never received. Patient is asking if rx can be resent to  [facility] [address] [place] - [place], [place] - [address] Phone: [phone]  Fax: [phone]     Please follow up with patient once rx is resent.    Patient said he is also having trouble getting tirzepatide (MOUNJARO) 7.5 MG/0.5ML Pen . Pharmacies are out of stock.

## 2023-04-24 NOTE — Telephone Encounter (Signed)
Requested Prescriptions  Pending Prescriptions Disp Refills   empagliflozin (JARDIANCE) 25 MG TABS tablet 90 tablet 1    Sig: Take 1 tablet (25 mg total) by mouth daily before breakfast.     Endocrinology:  Diabetes - SGLT2 Inhibitors Passed - 04/24/2023  9:44 AM      Passed - Cr in normal range and within 360 days    Creatinine, Ser  Date Value Ref Range Status  02/12/2023 0.83 0.76 - 1.27 mg/dL Final         Passed - HBA1C is between 0 and 7.9 and within 180 days    HB A1C (BAYER DCA - WAIVED)  Date Value Ref Range Status  02/12/2023 6.6 (H) 4.8 - 5.6 % Final    Comment:             Prediabetes: 5.7 - 6.4          Diabetes: >6.4          Glycemic control for adults with diabetes: <7.0          Passed - eGFR in normal range and within 360 days    eGFR  Date Value Ref Range Status  02/12/2023 100 >59 mL/min/1.73 Final         Passed - Valid encounter within last 6 months    Recent Outpatient Visits           2 months ago Uncontrolled type 2 diabetes mellitus with hyperglycemia (HCC)   [place] [facility] [place], [name], DO   4 months ago Diarrhea, unspecified type   [place] [facility] [place], [name], DO   5 months ago Uncontrolled type 2 diabetes mellitus with hyperglycemia (HCC)   [place] [facility] [place], [name], DO   7 months ago Uncontrolled type 2 diabetes mellitus with hyperglycemia (HCC)   [place] [name] Mecum, [name], PA-C   8 months ago Hyperlipidemia, unspecified hyperlipidemia type   [place] [facility] Mecum, [name], PA-C       Future Appointments             In 2 months Agbor-Etang, Arlys John, MD [facility] at [place]   In 3 months [name], DO [place] [facility], [facility]

## 2023-04-24 NOTE — Telephone Encounter (Signed)
Patient said he is also having trouble getting tirzepatide (MOUNJARO) 7.5 MG/0.5ML Pen . Pharmacies are out of stock.

## 2023-04-27 NOTE — Telephone Encounter (Signed)
Unfortunately it's on national back order. He can keep calling around for it- if he can't get it anywhere I can keep him on the 5mg  or change to ozempic

## 2023-04-29 MED ORDER — SEMAGLUTIDE (2 MG/DOSE) 8 MG/3ML ~~LOC~~ SOPN: 2.0000 mg | PEN_INJECTOR | SUBCUTANEOUS | 1 refills | Status: DC

## 2023-04-29 NOTE — Addendum Note (Signed)
Addended by: [name] on: 04/29/2023 10:00 AM   Modules accepted: Orders

## 2023-04-29 NOTE — Telephone Encounter (Signed)
Patient was notified and says what option does the provider recommend, as he was having a hard time getting his medication through the pharmacy. Please advise?

## 2023-04-29 NOTE — Telephone Encounter (Signed)
He should not, but I can't guarantee the supply. We haven't been hearing about any issues with it. I've sent it through for him.

## 2023-04-29 NOTE — Telephone Encounter (Signed)
See below. Those are the options.

## 2023-05-05 ENCOUNTER — Other Ambulatory Visit: Payer: Self-pay

## 2023-05-05 DIAGNOSIS — I10 Essential (primary) hypertension: Secondary | ICD-10-CM

## 2023-05-08 ENCOUNTER — Other Ambulatory Visit: Payer: Self-pay | Admitting: Family Medicine

## 2023-05-08 DIAGNOSIS — I10 Essential (primary) hypertension: Secondary | ICD-10-CM

## 2023-05-08 NOTE — Telephone Encounter (Signed)
Change of pharmacy Sent in the refill that was remaining on the previous order Requested Prescriptions  Pending Prescriptions Disp Refills   losartan-hydrochlorothiazide (HYZAAR) 100-25 MG tablet [Pharmacy Med Name: LOSARTAN-HCTZ 100-25 MG TAB[*]] 90 tablet 0    Sig: TAKE ONE TABLET BY MOUTH ONE TIME DAILY     Cardiovascular: ARB + Diuretic Combos Passed - 05/08/2023  9:29 AM      Passed - K in normal range and within 180 days    Potassium  Date Value Ref Range Status  02/12/2023 4.1 3.5 - 5.2 mmol/L Final         Passed - Na in normal range and within 180 days    Sodium  Date Value Ref Range Status  02/12/2023 141 134 - 144 mmol/L Final         Passed - Cr in normal range and within 180 days    Creatinine, Ser  Date Value Ref Range Status  02/12/2023 0.83 0.76 - 1.27 mg/dL Final         Passed - eGFR is 10 or above and within 180 days    eGFR  Date Value Ref Range Status  02/12/2023 100 >59 mL/min/1.73 Final         Passed - Patient is not pregnant      Passed - Last BP in normal range    BP Readings from Last 1 Encounters:  04/15/23 108/60         Passed - Valid encounter within last 6 months    Recent Outpatient Visits           2 months ago Uncontrolled type 2 diabetes mellitus with hyperglycemia (HCC)   [place] [facility] [place], [name], DO   4 months ago Diarrhea, unspecified type   [place] [facility] [place], [name], DO   5 months ago Uncontrolled type 2 diabetes mellitus with hyperglycemia (HCC)   [place] [facility] [place], [name], DO   8 months ago Uncontrolled type 2 diabetes mellitus with hyperglycemia (HCC)   [place] [facility] Mecum, [name], PA-C   8 months ago Hyperlipidemia, unspecified hyperlipidemia type   [place] [facility] Mecum, [name], PA-C       Future Appointments             In 1 month Agbor-Etang, Arlys John, MD [facility] at  [place]   In 3 months [name], DO [place] [facility], [facility]

## 2023-05-13 ENCOUNTER — Other Ambulatory Visit: Payer: Self-pay

## 2023-05-13 DIAGNOSIS — M545 Low back pain, unspecified: Secondary | ICD-10-CM

## 2023-05-13 MED ORDER — LIDOCAINE 5 % EX PTCH
1.0000 | MEDICATED_PATCH | CUTANEOUS | 1 refills | Status: AC
Start: 2023-05-13 — End: ?

## 2023-05-19 ENCOUNTER — Other Ambulatory Visit: Payer: Self-pay

## 2023-05-19 DIAGNOSIS — E785 Hyperlipidemia, unspecified: Secondary | ICD-10-CM

## 2023-05-19 MED ORDER — ATORVASTATIN CALCIUM 40 MG PO TABS: 40.0000 mg | ORAL_TABLET | Freq: Every day | ORAL | 0 refills | Status: DC

## 2023-06-02 ENCOUNTER — Other Ambulatory Visit: Payer: Medicaid Other

## 2023-06-19 ENCOUNTER — Other Ambulatory Visit: Payer: Self-pay | Admitting: Family Medicine

## 2023-06-20 ENCOUNTER — Ambulatory Visit: Payer: Self-pay | Attending: Cardiology

## 2023-06-20 NOTE — Telephone Encounter (Signed)
Requested Prescriptions  Pending Prescriptions Disp Refills   amLODipine (NORVASC) 5 MG tablet [Pharmacy Med Name: AMLODIPINE 5 MG TAB[*]] 90 tablet 0    Sig: TAKE ONE TABLET BY MOUTH ONE TIME DAILY     Cardiovascular: Calcium Channel Blockers 2 Passed - 06/19/2023  9:27 AM      Passed - Last BP in normal range    BP Readings from Last 1 Encounters:  04/15/23 108/60         Passed - Last Heart Rate in normal range    Pulse Readings from Last 1 Encounters:  04/15/23 69         Passed - Valid encounter within last 6 months    Recent Outpatient Visits           4 months ago Uncontrolled type 2 diabetes mellitus with hyperglycemia (HCC)   [place] [facility] [place], [name], DO   6 months ago Diarrhea, unspecified type   [place] [facility] [place], [name], DO   7 months ago Uncontrolled type 2 diabetes mellitus with hyperglycemia (HCC)   [place] [facility], [name], DO   9 months ago Uncontrolled type 2 diabetes mellitus with hyperglycemia (HCC)   [place] [facility] Mecum, [name], PA-C   10 months ago Hyperlipidemia, unspecified hyperlipidemia type   [place] [facility] Mecum, [name], PA-C       Future Appointments             In 1 week Agbor-Etang, Arlys John, MD [facility] at [place]   In 1 month [name], DO [place] [facility], [facility]

## 2023-06-27 ENCOUNTER — Other Ambulatory Visit: Payer: Self-pay

## 2023-06-27 DIAGNOSIS — E1165 Type 2 diabetes mellitus with hyperglycemia: Secondary | ICD-10-CM

## 2023-06-30 ENCOUNTER — Ambulatory Visit: Payer: Self-pay | Admitting: Cardiology

## 2023-06-30 MED ORDER — METFORMIN HCL 1000 MG PO TABS
1000.0000 mg | ORAL_TABLET | Freq: Two times a day (BID) | ORAL | 1 refills | Status: AC
Start: 2023-06-30 — End: ?

## 2023-07-09 ENCOUNTER — Other Ambulatory Visit: Payer: Self-pay | Admitting: Nurse Practitioner

## 2023-07-09 DIAGNOSIS — E785 Hyperlipidemia, unspecified: Secondary | ICD-10-CM

## 2023-07-10 NOTE — Telephone Encounter (Signed)
Requested Prescriptions  Pending Prescriptions Disp Refills   atorvastatin (LIPITOR) 40 MG tablet [Pharmacy Med Name: ATORVASTATIN 40 MG TAB] 90 tablet 1    Sig: TAKE ONE TABLET BY MOUTH ONE TIME DAILY     Cardiovascular:  Antilipid - Statins Failed - 07/09/2023 12:51 PM      Failed - Lipid Panel in normal range within the last 12 months    Cholesterol, Total  Date Value Ref Range Status  02/12/2023 112 100 - 199 mg/dL Final   LDL Chol Calc (NIH)  Date Value Ref Range Status  02/12/2023 46 0 - 99 mg/dL Final   HDL  Date Value Ref Range Status  02/12/2023 49 >39 mg/dL Final   Triglycerides  Date Value Ref Range Status  02/12/2023 89 0 - 149 mg/dL Final         Passed - Patient is not pregnant      Passed - Valid encounter within last 12 months    Recent Outpatient Visits           4 months ago Uncontrolled type 2 diabetes mellitus with hyperglycemia (HCC)   [facility] [place], [name], DO   7 months ago Diarrhea, unspecified type   [place] [facility] [place], [name], DO   7 months ago Uncontrolled type 2 diabetes mellitus with hyperglycemia (HCC)   [place] [facility] [place], [name], DO   10 months ago Uncontrolled type 2 diabetes mellitus with hyperglycemia (HCC)   [place] [facility] Mecum, [name], PA-C   11 months ago Hyperlipidemia, unspecified hyperlipidemia type   [place] [facility] Mecum, [name], PA-C       Future Appointments             In 1 month [name], [name], DO [place] [facility]

## 2023-07-11 ENCOUNTER — Other Ambulatory Visit: Payer: Self-pay | Admitting: Family Medicine

## 2023-07-12 ENCOUNTER — Other Ambulatory Visit: Payer: Self-pay | Admitting: Family Medicine

## 2023-07-12 DIAGNOSIS — I10 Essential (primary) hypertension: Secondary | ICD-10-CM

## 2023-07-14 NOTE — Telephone Encounter (Signed)
Requested Prescriptions  Refused Prescriptions Disp Refills   losartan-hydrochlorothiazide (HYZAAR) 100-25 MG tablet [Pharmacy Med Name: LOSARTAN-HCTZ 100-25 MG TAB[*]] 90 tablet 0    Sig: TAKE ONE TABLET BY MOUTH ONE TIME DAILY     Cardiovascular: ARB + Diuretic Combos Passed - 07/12/2023 10:45 AM      Passed - K in normal range and within 180 days    Potassium  Date Value Ref Range Status  02/12/2023 4.1 3.5 - 5.2 mmol/L Final         Passed - Na in normal range and within 180 days    Sodium  Date Value Ref Range Status  02/12/2023 141 134 - 144 mmol/L Final         Passed - Cr in normal range and within 180 days    Creatinine, Ser  Date Value Ref Range Status  02/12/2023 0.83 0.76 - 1.27 mg/dL Final         Passed - eGFR is 10 or above and within 180 days    eGFR  Date Value Ref Range Status  02/12/2023 100 >59 mL/min/1.73 Final         Passed - Patient is not pregnant      Passed - Last BP in normal range    BP Readings from Last 1 Encounters:  04/15/23 108/60         Passed - Valid encounter within last 6 months    Recent Outpatient Visits           5 months ago Uncontrolled type 2 diabetes mellitus with hyperglycemia (HCC)   [place] [facility] [place], [name], DO   7 months ago Diarrhea, unspecified type   [place] [facility] [place], [name], DO   8 months ago Uncontrolled type 2 diabetes mellitus with hyperglycemia (HCC)   [place] [facility] [place], [name], DO   10 months ago Uncontrolled type 2 diabetes mellitus with hyperglycemia (HCC)   [place] [facility] Mecum, [name], PA-C   11 months ago Hyperlipidemia, unspecified hyperlipidemia type   [place] [facility] Mecum, [name], PA-C       Future Appointments             In 1 month [name], [name], DO [place] [facility], [facility]

## 2023-07-14 NOTE — Telephone Encounter (Signed)
Refilled 06/20/23 # 90. Too early. Requested Prescriptions  Refused Prescriptions Disp Refills   amLODipine (NORVASC) 5 MG tablet [Pharmacy Med Name: AMLODIPINE 5 MG TAB[*]] 90 tablet 0    Sig: TAKE ONE TABLET BY MOUTH ONE TIME DAILY     Cardiovascular: Calcium Channel Blockers 2 Passed - 07/11/2023 10:08 AM      Passed - Last BP in normal range    BP Readings from Last 1 Encounters:  04/15/23 108/60         Passed - Last Heart Rate in normal range    Pulse Readings from Last 1 Encounters:  04/15/23 69         Passed - Valid encounter within last 6 months    Recent Outpatient Visits           5 months ago Uncontrolled type 2 diabetes mellitus with hyperglycemia (HCC)   [place] [facility] [place], [name], DO   7 months ago Diarrhea, unspecified type   [place] [facility] [place], [name], DO   8 months ago Uncontrolled type 2 diabetes mellitus with hyperglycemia (HCC)   [place] [facility], [name], DO   10 months ago Uncontrolled type 2 diabetes mellitus with hyperglycemia (HCC)   [place] [facility] Mecum, [name], PA-C   11 months ago Hyperlipidemia, unspecified hyperlipidemia type   [place] [facility] Mecum, [name], PA-C       Future Appointments             In 1 month [name], [name], DO [place] [facility]

## 2023-08-08 ENCOUNTER — Other Ambulatory Visit: Payer: Self-pay | Admitting: Family Medicine

## 2023-08-08 DIAGNOSIS — E1165 Type 2 diabetes mellitus with hyperglycemia: Secondary | ICD-10-CM

## 2023-08-08 DIAGNOSIS — G8929 Other chronic pain: Secondary | ICD-10-CM

## 2023-08-08 DIAGNOSIS — E785 Hyperlipidemia, unspecified: Secondary | ICD-10-CM

## 2023-08-08 DIAGNOSIS — E119 Type 2 diabetes mellitus without complications: Secondary | ICD-10-CM

## 2023-08-08 DIAGNOSIS — I10 Essential (primary) hypertension: Secondary | ICD-10-CM

## 2023-08-08 MED ORDER — LOSARTAN POTASSIUM-HCTZ 100-25 MG PO TABS: 1.0000 | ORAL_TABLET | Freq: Every day | ORAL | 0 refills | Status: DC

## 2023-08-08 MED ORDER — GABAPENTIN 100 MG PO CAPS: 100.0000 mg | ORAL_CAPSULE | Freq: Every day | ORAL | 0 refills | Status: DC

## 2023-08-08 NOTE — Telephone Encounter (Signed)
Medication Refill - Medication: glucose blood (ACCU-CHEK AVIVA PLUS) test strip [  gabapentin (NEURONTIN) 100 MG capsul  empagliflozin (JARDIANCE) 25 MG TABS table  Semaglutide, 2 MG/DOSE, 8 MG/3ML SOPN  losartan-hydrochlorothiazide (HYZAAR) 100-25 MG tablet  lidocaine (LIDODERM) 5 %  amLODipine (NORVASC) 5 MG tablet  metFORMIN (GLUCOPHAGE) 1000 MG table  atorvastatin (LIPITOR) 40 MG tablet  Has the patient contacted their pharmacy? Yes.    Preferred Pharmacy (with phone number or street name):    [facility] [address] [facility] - [place], [place] - [address]  [address] [place], [place] [address]  Phone:  [phone]  Fax:  [phone]  Has the patient been seen for an appointment in the last year OR does the patient have an upcoming appointment? Yes.    Agent: Please be advised that RX refills may take up to 3 business days. We ask that you follow-up with your pharmacy.

## 2023-08-08 NOTE — Telephone Encounter (Signed)
After review of all medications requested- only gabapentin and losartan are due RF- others should be on file at [facility] pharmacy Requested Prescriptions  Pending Prescriptions Disp Refills   glucose blood (ACCU-CHEK AVIVA PLUS) test strip 100 each 12    Sig: Use as instructed     Endocrinology: Diabetes - Testing Supplies Passed - 08/08/2023 10:02 AM      Passed - Valid encounter within last 12 months    Recent Outpatient Visits           5 months ago Uncontrolled type 2 diabetes mellitus with hyperglycemia (HCC)   [place] [facility] [place], Devin P, DO   7 months ago Diarrhea, unspecified type   [place] [facility], Devin P, DO   8 months ago Uncontrolled type 2 diabetes mellitus with hyperglycemia (HCC)   [place] [facility], Devin P, DO   11 months ago Uncontrolled type 2 diabetes mellitus with hyperglycemia (HCC)   [place] [facility] Mecum, [name], PA-C   11 months ago Hyperlipidemia, unspecified hyperlipidemia type   [place] [facility] Mecum, [name], PA-C       Future Appointments             In 1 month [name], Devin P, DO [place] [facility]             gabapentin (NEURONTIN) 100 MG capsule 90 capsule 1    Sig: Take 1 capsule (100 mg total) by mouth at bedtime.     Neurology: Anticonvulsants - gabapentin Passed - 08/08/2023 10:02 AM      Passed - Cr in normal range and within 360 days    Creatinine, Ser  Date Value Ref Range Status  02/12/2023 0.83 0.76 - 1.27 mg/dL Final         Passed - Completed PHQ-2 or PHQ-9 in the last 360 days      Passed - Valid encounter within last 12 months    Recent Outpatient Visits           5 months ago Uncontrolled type 2 diabetes mellitus with hyperglycemia (HCC)   [place] [facility] [place], Devin P, DO   7 months ago Diarrhea, unspecified type   [facility], Dev[facility] P, DO   8 months ago Uncontrolled type 2 diabetes mellitus with hyperglycemia (HCC)   [place] [facility], Devin P, DO   11 months ago Uncontrolled type 2 diabetes mellitus with hyperglycemia (HCC)   [place] [facility] Mecum, [name], PA-C   11 months ago Hyperlipidemia, unspecified hyperlipidemia type   [place] [facility] Mecum, [name], PA-C       Future Appointments             In 1 month [name], Devin P, DO [place] [facility]             empagliflozin (JARDIANCE) 25 MG TABS tablet 90 tablet 1    Sig: Take 1 tablet (25 mg total) by mouth daily before breakfast.     Endocrinology:  Diabetes - SGLT2 Inhibitors Passed - 08/08/2023 10:02 AM      Passed - Cr in normal range and within 360 days    Creatinine, Ser  Date Value Ref Range Status  02/12/2023 0.83 0.76 - 1.27 mg/dL Final         Passed - HBA1C  is between 0 and 7.9 and within 180 days    HB A1C (BAYER DCA - WAIVED)  Date Value Ref Range Status  02/12/2023 6.6 (H) 4.8 - 5.6 % Final    Comment:             Prediabetes: 5.7 - 6.4          Diabetes: >6.4          Glycemic control for adults with diabetes: <7.0          Passed - eGFR in normal range and within 360 days    eGFR  Date Value Ref Range Status  02/12/2023 100 >59 mL/min/1.73 Final         Passed - Valid encounter within last 6 months    Recent Outpatient Visits           5 months ago Uncontrolled type 2 diabetes mellitus with hyperglycemia (HCC)   [place] [facility] [place], Devin P, DO   7 months ago Diarrhea, unspecified type   [place] [facility], Devin P, DO   8 months ago Uncontrolled type 2 diabetes mellitus with hyperglycemia (HCC)   [place] [facility], Devin P, DO   11 months ago Uncontrolled type 2 diabetes mellitus with hyperglycemia (HCC)   [place] [facility] Mecum, [name], PA-C   11 months ago Hyperlipidemia, unspecified hyperlipidemia type   [place] [facility] Mecum, [name], PA-C       Future Appointments             In 1 month [name], Devin P, DO [place] [facility]             Semaglutide, 2 MG/DOSE, 8 MG/3ML SOPN 9 mL 1    Sig: Inject 2 mg as directed once a week.     Endocrinology:  Diabetes - GLP-1 Receptor Agonists - semaglutide Failed - 08/08/2023 10:02 AM      Failed - HBA1C in normal range and within 180 days    HB A1C (BAYER DCA - WAIVED)  Date Value Ref Range Status  02/12/2023 6.6 (H) 4.8 - 5.6 % Final    Comment:             Prediabetes: 5.7 - 6.4          Diabetes: >6.4          Glycemic control for adults with diabetes: <7.0          Passed - Cr in normal range and within 360 days    Creatinine, Ser  Date Value Ref Range Status  02/12/2023 0.83 0.76 - 1.27 mg/dL Final         Passed - Valid encounter within last 6 months    Recent Outpatient Visits           5 months ago Uncontrolled type 2 diabetes mellitus with hyperglycemia (HCC)   [place] [facility] [place], Devin P, DO   7 months ago Diarrhea, unspecified type   [place] [name], Devin P, DO   8 months ago Uncontrolled type 2 diabetes mellitus with hyperglycemia (HCC)   [place] [facility], Devin P, DO   11 months ago Uncontrolled type 2 diabetes mellitus with hyperglycemia (HCC)   [place] [facility] Mecum, [name], PA-C   11 months ago Hyperlipidemia, unspecified hyperlipidemia type   [place] [facility] Mecum, [name], PA-C       Future Appointments             In 1 month [name], [name], DO [place] [facility]             losartan-hydrochlorothiazide (HYZAAR) 100-25 MG tablet 90 tablet 0    Sig: Take 1 tablet by mouth daily.     Cardiovascular: ARB +  Diuretic Combos Passed - 08/08/2023 10:02 AM      Passed - K in normal range and within 180 days    Potassium  Date Value Ref Range Status  02/12/2023 4.1 3.5 - 5.2 mmol/L Final         Passed - Na in normal range and within 180 days    Sodium  Date Value Ref Range Status  02/12/2023 141 134 - 144 mmol/L Final         Passed - Cr in normal range and within 180 days    Creatinine, Ser  Date Value Ref Range Status  02/12/2023 0.83 0.76 - 1.27 mg/dL Final         Passed - eGFR is 10 or above and within 180 days    eGFR  Date Value Ref Range Status  02/12/2023 100 >59 mL/min/1.73 Final         Passed - Patient is not pregnant      Passed - Last BP in normal range    BP Readings from Last 1 Encounters:  04/15/23 108/60         Passed - Valid encounter within last 6 months    Recent Outpatient Visits           5 months ago Uncontrolled type 2 diabetes mellitus with hyperglycemia (HCC)   [place] [facility] [place], Devin P, DO   7 months ago Diarrhea, unspecified type   [place] [facility] [place], Devin P, DO   8 months ago Uncontrolled type 2 diabetes mellitus with hyperglycemia (HCC)   [place] [facility], Devin P, DO   11 months ago Uncontrolled type 2 diabetes mellitus with hyperglycemia (HCC)   [place] [facility] Mecum, [name], PA-C   11 months ago Hyperlipidemia, unspecified hyperlipidemia type   [place] [facility] Mecum, [name], PA-C       Future Appointments             In 1 month [name], Devin P, DO [place] [facility]             lidocaine (LIDODERM) 5 % 90 patch 1    Sig: Place 1 patch onto the skin daily. Remove & Discard patch within 12 hours or as directed by MD     Analgesics:  Topicals Failed - 08/08/2023 10:02 AM      Failed - Manual Review: Labs are only required if the patient has taken medication for more than 8 weeks.       Passed - PLT in normal range and within 360 days    Platelets  Date Value Ref Range Status  02/12/2023 223 150 - 450 x10E3/uL Final         Passed - HGB in normal range and within 360 days    Hemoglobin  Date Value Ref Range Status  02/12/2023 16.3 13.0 - 17.7 g/dL Final         Passed - HCT in normal range and  within 360 days    Hematocrit  Date Value Ref Range Status  02/12/2023 48.5 37.5 - 51.0 % Final         Passed - Cr in normal range and within 360 days    Creatinine, Ser  Date Value Ref Range Status  02/12/2023 0.83 0.76 - 1.27 mg/dL Final         Passed - eGFR is 30 or above and within 360 days    eGFR  Date Value Ref Range Status  02/12/2023 100 >59 mL/min/1.73 Final         Passed - Patient is not pregnant      Passed - Valid encounter within last 12 months    Recent Outpatient Visits           5 months ago Uncontrolled type 2 diabetes mellitus with hyperglycemia (HCC)   [place] [facility], Devin P, DO   7 months ago Diarrhea, unspecified type   [place] [facility], Devin P, DO   8 months ago Uncontrolled type 2 diabetes mellitus with hyperglycemia (HCC)   [place] [facility], Devin P, DO   11 months ago Uncontrolled type 2 diabetes mellitus with hyperglycemia (HCC)   [place] [facility] Mecum, [name], PA-C   11 months ago Hyperlipidemia, unspecified hyperlipidemia type   [place] [facility] Mecum, [name], PA-C       Future Appointments             In 1 month [name], Devin P, DO [place] [facility]             amLODipine (NORVASC) 5 MG tablet 90 tablet 0    Sig: Take 1 tablet (5 mg total) by mouth daily.     Cardiovascular: Calcium Channel Blockers 2 Passed - 08/08/2023 10:02 AM      Passed - Last BP in normal range    BP Readings from Last 1 Encounters:  04/15/23 108/60         Passed - Last Heart Rate in  normal range    Pulse Readings from Last 1 Encounters:  04/15/23 69         Passed - Valid encounter within last 6 months    Recent Outpatient Visits           5 months ago Uncontrolled type 2 diabetes mellitus with hyperglycemia (HCC)   [place] [facility] [place], Devin P, DO   7 months ago Diarrhea, unspecified type   [place] [facility], Devin P, DO   8 months ago Uncontrolled type 2 diabetes mellitus with hyperglycemia [facility])   [place] [facility], Devin P, DO   11 months ago Uncontrolled type 2 diabetes mellitus with hyperglycemia (HCC)   [place] [facility] Mecum, [name], PA-C   11 months ago Hyperlipidemia, unspecified hyperlipidemia type    [facility] Mecum, [name], PA-C       Future Appointments             In 1 month [name], [name], DO  [facility]             metFORMIN (GLUCOPHAGE) 1000 MG tablet 180 tablet 1    Sig: Take 1 tablet (1,000 mg total) by mouth 2 (two) times daily with a meal.     Endocrinology:  Diabetes - Biguanides  Failed - 08/08/2023 10:02 AM      Failed - B12 Level in normal range and within 720 days    Vitamin B-12  Date Value Ref Range Status  08/14/2022 1,909 (H) 232 - 1,245 pg/mL Final         Passed - Cr in normal range and within 360 days    Creatinine, Ser  Date Value Ref Range Status  02/12/2023 0.83 0.76 - 1.27 mg/dL Final         Passed - HBA1C is between 0 and 7.9 and within 180 days    HB A1C (BAYER DCA - WAIVED)  Date Value Ref Range Status  02/12/2023 6.6 (H) 4.8 - 5.6 % Final    Comment:             Prediabetes: 5.7 - 6.4          Diabetes: >6.4          Glycemic control for adults with diabetes: <7.0          Passed - eGFR in normal range and within 360 days    eGFR  Date Value Ref Range Status  02/12/2023 100 >59 mL/min/1.73 Final         Passed - Valid encounter within  last 6 months    Recent Outpatient Visits           5 months ago Uncontrolled type 2 diabetes mellitus with hyperglycemia (HCC)   [place] [facility] [place], Devin P, DO   7 months ago Diarrhea, unspecified type   [place] [facility] [place], Devin P, DO   8 months ago Uncontrolled type 2 diabetes mellitus with hyperglycemia (HCC)   [place] [facility], Devin P, DO   11 months ago Uncontrolled type 2 diabetes mellitus with hyperglycemia (HCC)   [place] [facility] Mecum, [name], PA-C   11 months ago Hyperlipidemia, unspecified hyperlipidemia type   [place] [facility] Mecum, [name], PA-C       Future Appointments             In 1 month [name], [name], DO [place] [facility]            Passed - CBC within normal limits and completed in the last 12 months    WBC  Date Value Ref Range Status  02/12/2023 6.4 3.4 - 10.8 x10E3/uL Final   RBC  Date Value Ref Range Status  02/12/2023 5.52 4.14 - 5.80 x10E6/uL Final   Hemoglobin  Date Value Ref Range Status  02/12/2023 16.3 13.0 - 17.7 g/dL Final   Hematocrit  Date Value Ref Range Status  02/12/2023 48.5 37.5 - 51.0 % Final   MCHC  Date Value Ref Range Status  02/12/2023 33.6 31.5 - 35.7 g/dL Final   [facility]  Date Value Ref Range Status  02/12/2023 29.5 26.6 - 33.0 pg Final   MCV  Date Value Ref Range Status  02/12/2023 88 79 - 97 fL Final   No results found for: "PLTCOUNTKUC", "LABPLAT", "POCPLA" RDW  Date Value Ref Range Status  02/12/2023 13.6 11.6 - 15.4 % Final          atorvastatin (LIPITOR) 40 MG tablet 90 tablet 1    Sig: Take 1 tablet (40 mg total) by mouth daily.     Cardiovascular:  Antilipid - Statins Failed - 08/08/2023 10:02 AM      Failed - Lipid Panel  in normal range within the last 12 months    Cholesterol, Total  Date Value Ref Range Status  02/12/2023 112 100 - 199 mg/dL Final    LDL Chol Calc (NIH)  Date Value Ref Range Status  02/12/2023 46 0 - 99 mg/dL Final   HDL  Date Value Ref Range Status  02/12/2023 49 >39 mg/dL Final   Triglycerides  Date Value Ref Range Status  02/12/2023 89 0 - 149 mg/dL Final         Passed - Patient is not pregnant      Passed - Valid encounter within last 12 months    Recent Outpatient Visits           5 months ago Uncontrolled type 2 diabetes mellitus with hyperglycemia (HCC)   [place] [facility] [place], Devin P, DO   7 months ago Diarrhea, unspecified type   [place] [facility] [place], Devin P, DO   8 months ago Uncontrolled type 2 diabetes mellitus with hyperglycemia (HCC)   [facility], [facility] P, DO   11 months ago Uncontrolled type 2 diabetes mellitus with hyperglycemia (HCC)   [place] [facility] Mecum, [name], PA-C   11 months ago Hyperlipidemia, unspecified hyperlipidemia type   [place] [facility] Mecum, [name], PA-C       Future Appointments             In 1 month [name], [name], DO [place] [facility]

## 2023-08-15 ENCOUNTER — Encounter: Payer: Self-pay | Admitting: Family Medicine

## 2023-09-04 ENCOUNTER — Encounter: Payer: Self-pay | Admitting: *Deleted

## 2023-09-04 ENCOUNTER — Emergency Department
Admission: EM | Admit: 2023-09-04 | Discharge: 2023-09-04 | Disposition: A | Discharge status: Home / Self Care | Payer: Self-pay | Attending: Emergency Medicine | Admitting: Emergency Medicine

## 2023-09-04 ENCOUNTER — Other Ambulatory Visit: Payer: Self-pay

## 2023-09-04 DIAGNOSIS — R739 Hyperglycemia, unspecified: Secondary | ICD-10-CM

## 2023-09-04 DIAGNOSIS — I10 Essential (primary) hypertension: Secondary | ICD-10-CM | POA: Insufficient documentation

## 2023-09-04 DIAGNOSIS — Z91148 Patient's other noncompliance with medication regimen for other reason: Secondary | ICD-10-CM | POA: Insufficient documentation

## 2023-09-04 DIAGNOSIS — E1165 Type 2 diabetes mellitus with hyperglycemia: Secondary | ICD-10-CM | POA: Diagnosis not present

## 2023-09-04 LAB — CBG MONITORING, ED
Glucose-Capillary: >600 mg/dL (ref 70–99)
Glucose-Capillary: >600 mg/dL (ref 70–99)
Glucose-Capillary: >600 mg/dL (ref 70–99)
Glucose-Capillary: 475 mg/dL — ABNORMAL HIGH (ref 70–99)
Glucose-Capillary: 478 mg/dL — ABNORMAL HIGH (ref 70–99)
Glucose-Capillary: 330 mg/dL — ABNORMAL HIGH (ref 70–99)

## 2023-09-04 LAB — URINALYSIS, ROUTINE W REFLEX MICROSCOPIC
Bacteria, UA: NONE SEEN
Bilirubin Urine: NEGATIVE
Glucose, UA: >=500 mg/dL — AB
Hgb urine dipstick: NEGATIVE
Ketones, ur: NEGATIVE mg/dL
Leukocytes,Ua: NEGATIVE
Nitrite: NEGATIVE
Protein, ur: NEGATIVE mg/dL
RBC / HPF: 0 RBC/hpf (ref 0–5)
Specific Gravity, Urine: 1.028 (ref 1.005–1.030)
Squamous Epithelial / HPF: 0 /[HPF] (ref 0–5)
pH: 5 (ref 5.0–8.0)

## 2023-09-04 LAB — CBC
HCT: 41.3 % (ref 39.0–52.0)
Hemoglobin: 15.3 g/dL (ref 13.0–17.0)
MCH: 29.7 pg (ref 26.0–34.0)
MCHC: 37 g/dL — ABNORMAL HIGH (ref 30.0–36.0)
MCV: 80.2 fL (ref 80.0–100.0)
Platelets: 208 10*3/uL (ref 150–400)
RBC: 5.15 MIL/uL (ref 4.22–5.81)
RDW: 12.1 % (ref 11.5–15.5)
WBC: 7 10*3/uL (ref 4.0–10.5)
nRBC: 0 % (ref 0.0–0.2)

## 2023-09-04 LAB — BASIC METABOLIC PANEL
Anion gap: 15 (ref 5–15)
BUN: 15 mg/dL (ref 8–23)
CO2: 24 mmol/L (ref 22–32)
Calcium: 8.9 mg/dL (ref 8.9–10.3)
Chloride: 86 mmol/L — ABNORMAL LOW (ref 98–111)
Creatinine, Ser: 1.04 mg/dL (ref 0.61–1.24)
GFR, Estimated: >60 mL/min (ref >60)
Glucose, Bld: 879 mg/dL (ref 70–99)
Potassium: 3.9 mmol/L (ref 3.5–5.1)
Sodium: 125 mmol/L — ABNORMAL LOW (ref 135–145)

## 2023-09-04 LAB — GLUCOSE, RANDOM: Glucose, Bld: 696 mg/dL (ref 70–99)

## 2023-09-04 MED ORDER — SODIUM CHLORIDE 0.9 % IV BOLUS
1000.0000 mL | Freq: Once | INTRAVENOUS | Status: AC
  Administered 2023-09-04: 1000 mL via INTRAVENOUS

## 2023-09-04 MED ORDER — EMPAGLIFLOZIN 25 MG PO TABS
25.0000 mg | ORAL_TABLET | Freq: Every day | ORAL | 1 refills | Status: DC
Start: 2023-09-04 — End: 2023-10-09
  Filled 2023-09-04: qty 30, 30d supply, fill #0
  Filled 2023-09-23 – 2023-10-03 (×3): qty 30, 30d supply, fill #1

## 2023-09-04 MED ORDER — INSULIN ASPART 100 UNIT/ML IJ SOLN
8.0000 [IU] | Freq: Once | INTRAMUSCULAR | Status: AC
  Administered 2023-09-04: 8 [IU] via INTRAVENOUS
  Filled 2023-09-04: qty 1

## 2023-09-04 MED ORDER — INSULIN ASPART 100 UNIT/ML IJ SOLN: 5.0000 [IU] | Freq: Once | INTRAMUSCULAR | Status: DC

## 2023-09-04 NOTE — ED Triage Notes (Signed)
Pt ambulatory to triage.  Pt reports feeling weak.  Pt has diabetes and states he has been voiding a lot and is thirsty.  Pt denies any pain.  Pt alert.

## 2023-09-04 NOTE — ED Notes (Signed)
Fsbs HIGH in triage.

## 2023-09-04 NOTE — ED Provider Notes (Signed)
[facility] Provider Note    Event Date/Time   First MD Initiated Contact with Patient 09/04/23 1723     (approximate)   History   Chief Complaint Hyperglycemia   HPI  Devin Arias is a 62 y.o. male with past medical history of hypertension, hyperlipidemia, diabetes who presents to the ED complaining of hyperglycemia.  Patient reports that for about the past week he has been feeling very thirsty and urinating frequently.  This been associated with generalized weakness and malaise.  Patient states that his blood sugar has been running high, he has continued to take metformin but states he has been unable to afford his other diabetic medications.  He denies any fevers, cough, chest pain, shortness of breath, nausea, vomiting, diarrhea, or dysuria.     Physical Exam   Triage Vital Signs: ED Triage Vitals  Encounter Vitals Group     BP 09/04/23 1633 122/78     Systolic BP Percentile --      Diastolic BP Percentile --      Pulse Rate 09/04/23 1633 78     Resp 09/04/23 1633 20     Temp 09/04/23 1633 97.7 F (36.5 C)     Temp Source 09/04/23 1633 Oral     SpO2 09/04/23 1633 95 %     Weight 09/04/23 1629 185 lb (83.9 kg)     Height 09/04/23 1629 5\' 7"  (1.702 m)     Head Circumference --      Peak Flow --      Pain Score 09/04/23 1629 6     Pain Loc --      Pain Education --      Exclude from Growth Chart --     Most recent vital signs: Vitals:   09/04/23 2030 09/04/23 2100  BP: 121/72 110/68  Pulse: 72 67  Resp: 19 18  Temp:    SpO2: 97% 96%    Constitutional: Alert and oriented. Eyes: Conjunctivae are normal. Head: Atraumatic. Nose: No congestion/rhinnorhea. Mouth/Throat: Mucous membranes are dry. Cardiovascular: Normal rate, regular rhythm. Grossly normal heart sounds.  2+ radial pulses bilaterally. Respiratory: Normal respiratory effort.  No retractions. Lungs CTAB. Gastrointestinal: Soft and nontender. No  distention. Musculoskeletal: No lower extremity tenderness nor edema.  Neurologic:  Normal speech and language. No gross focal neurologic deficits are appreciated.    ED Results / Procedures / Treatments   Labs (all labs ordered are listed, but only abnormal results are displayed) Labs Reviewed  BASIC METABOLIC PANEL - Abnormal; Notable for the following components:      Result Value   Sodium 125 (*)    Chloride 86 (*)    Glucose, Bld 879 (*)    All other components within normal limits  CBC - Abnormal; Notable for the following components:   MCHC 37.0 (*)    All other components within normal limits  URINALYSIS, ROUTINE W REFLEX MICROSCOPIC - Abnormal; Notable for the following components:   Color, Urine COLORLESS (*)    APPearance CLEAR (*)    Glucose, UA >=500 (*)    All other components within normal limits  GLUCOSE, RANDOM - Abnormal; Notable for the following components:   Glucose, Bld 696 (*)    All other components within normal limits  CBG MONITORING, ED - Abnormal; Notable for the following components:   Glucose-Capillary >600 (*)    All other components within normal limits  CBG MONITORING, ED - Abnormal; Notable for the following components:  Glucose-Capillary >600 (*)    All other components within normal limits  CBG MONITORING, ED - Abnormal; Notable for the following components:   Glucose-Capillary >600 (*)    All other components within normal limits  CBG MONITORING, ED - Abnormal; Notable for the following components:   Glucose-Capillary 475 (*)    All other components within normal limits  CBG MONITORING, ED - Abnormal; Notable for the following components:   Glucose-Capillary 478 (*)    All other components within normal limits  CBG MONITORING, ED - Abnormal; Notable for the following components:   Glucose-Capillary 330 (*)    All other components within normal limits    PROCEDURES:  Critical Care performed: No  Procedures   MEDICATIONS  ORDERED IN ED: Medications  sodium chloride 0.9 % bolus 1,000 mL (1,000 mLs Intravenous Bolus 09/04/23 1813)  insulin aspart (novoLOG) injection 8 Units (8 Units Intravenous Given 09/04/23 1800)  sodium chloride 0.9 % bolus 1,000 mL (0 mLs Intravenous Stopped 09/04/23 2034)  insulin aspart (novoLOG) injection 8 Units (8 Units Intravenous Given 09/04/23 1925)  insulin aspart (novoLOG) injection 8 Units (8 Units Intravenous Given 09/04/23 2115)     IMPRESSION / MDM / ASSESSMENT AND PLAN / ED COURSE  I reviewed the triage vital signs and the nursing notes.                              62 y.o. male with past medical history of hypertension, hyperlipidemia, and diabetes who presents to the ED with 1 week of hyperglycemia, polyuria and polydipsia.  Patient's presentation is most consistent with acute presentation with potential threat to life or bodily function.  Differential diagnosis includes, but is not limited to, hypoglycemia, dehydration, electrolyte abnormality, AKI, DKA.  Patient nontoxic-appearing and in no acute distress, vital signs are unremarkable.  He does appear clinically dehydrated with blood glucose greater than 800.  Labs show no evidence of DKA, electrolyte abnormality, or AKI.  No significant anemia or leukocytosis noted, suspect this is due to medication noncompliance rather than any infectious process.  We will hydrate with IV fluids, give dose of IV insulin and recheck blood glucose.  No symptoms to suggest HHS at this time.  Blood glucose now improved following IV insulin and IV fluids.  Patient reports symptoms improved as well and he is appropriate for outpatient management.  We will send prescription for Jardiance to community pharmacy to help patient with cost.  I have also encouraged him to speak with his PCP about an alternative medication.  He was counseled to return to the ED for new or worsening symptoms, patient agrees with plan.      FINAL CLINICAL IMPRESSION(S) /  ED DIAGNOSES   Final diagnoses:  Hyperglycemia  Noncompliance with medication regimen     Rx / DC Orders   ED Discharge Orders          Ordered    empagliflozin (JARDIANCE) 25 MG TABS tablet  Daily before breakfast        09/04/23 2236             Note:  This document was prepared using Dragon voice recognition software and may include unintentional dictation errors.   [name], MD 09/04/23 2237

## 2023-09-05 ENCOUNTER — Other Ambulatory Visit: Payer: Self-pay

## 2023-09-08 ENCOUNTER — Other Ambulatory Visit: Payer: Self-pay

## 2023-09-10 ENCOUNTER — Encounter (HOSPITAL_COMMUNITY): Payer: Self-pay | Admitting: Emergency Medicine

## 2023-09-10 ENCOUNTER — Emergency Department (HOSPITAL_COMMUNITY)
Admission: EM | Admit: 2023-09-10 | Discharge: 2023-09-11 | Discharge status: Against Medical Advice | Payer: Medicaid Other | Attending: Emergency Medicine | Admitting: Emergency Medicine

## 2023-09-10 DIAGNOSIS — Z5321 Procedure and treatment not carried out due to patient leaving prior to being seen by health care provider: Secondary | ICD-10-CM | POA: Insufficient documentation

## 2023-09-10 DIAGNOSIS — R519 Headache, unspecified: Secondary | ICD-10-CM | POA: Diagnosis present

## 2023-09-10 DIAGNOSIS — R197 Diarrhea, unspecified: Secondary | ICD-10-CM | POA: Diagnosis not present

## 2023-09-10 DIAGNOSIS — R6883 Chills (without fever): Secondary | ICD-10-CM | POA: Diagnosis not present

## 2023-09-10 LAB — URINALYSIS, ROUTINE W REFLEX MICROSCOPIC
Bacteria, UA: NONE SEEN
Bilirubin Urine: NEGATIVE
Glucose, UA: >=500 mg/dL — AB
Hgb urine dipstick: NEGATIVE
Ketones, ur: 20 mg/dL — AB
Leukocytes,Ua: NEGATIVE
Nitrite: NEGATIVE
Protein, ur: NEGATIVE mg/dL
Specific Gravity, Urine: 1.029 (ref 1.005–1.030)
pH: 5 (ref 5.0–8.0)

## 2023-09-10 LAB — CBC WITH DIFFERENTIAL/PLATELET
Abs Immature Granulocytes: 0.04 10*3/uL (ref 0.00–0.07)
Basophils Absolute: 0 10*3/uL (ref 0.0–0.1)
Basophils Relative: 1 %
Eosinophils Absolute: 0.2 10*3/uL (ref 0.0–0.5)
Eosinophils Relative: 2 %
HCT: 45.3 % (ref 39.0–52.0)
Hemoglobin: 16.2 g/dL (ref 13.0–17.0)
Immature Granulocytes: 1 %
Lymphocytes Relative: 27 %
Lymphs Abs: 2.4 10*3/uL (ref 0.7–4.0)
MCH: 29.2 pg (ref 26.0–34.0)
MCHC: 35.8 g/dL (ref 30.0–36.0)
MCV: 81.6 fL (ref 80.0–100.0)
Monocytes Absolute: 0.7 10*3/uL (ref 0.1–1.0)
Monocytes Relative: 8 %
Neutro Abs: 5.4 10*3/uL (ref 1.7–7.7)
Neutrophils Relative %: 61 %
Platelets: 257 10*3/uL (ref 150–400)
RBC: 5.55 MIL/uL (ref 4.22–5.81)
RDW: 12.3 % (ref 11.5–15.5)
WBC: 8.7 10*3/uL (ref 4.0–10.5)
nRBC: 0 % (ref 0.0–0.2)

## 2023-09-10 LAB — COMPREHENSIVE METABOLIC PANEL
ALT: 23 U/L (ref 0–44)
AST: 15 U/L (ref 15–41)
Albumin: 4.1 g/dL (ref 3.5–5.0)
Alkaline Phosphatase: 86 U/L (ref 38–126)
Anion gap: 17 — ABNORMAL HIGH (ref 5–15)
BUN: 21 mg/dL (ref 8–23)
CO2: 24 mmol/L (ref 22–32)
Calcium: 9.4 mg/dL (ref 8.9–10.3)
Chloride: 93 mmol/L — ABNORMAL LOW (ref 98–111)
Creatinine, Ser: 1.14 mg/dL (ref 0.61–1.24)
GFR, Estimated: >60 mL/min (ref >60)
Glucose, Bld: 269 mg/dL — ABNORMAL HIGH (ref 70–99)
Potassium: 3.4 mmol/L — ABNORMAL LOW (ref 3.5–5.1)
Sodium: 134 mmol/L — ABNORMAL LOW (ref 135–145)
Total Bilirubin: 1.7 mg/dL — ABNORMAL HIGH (ref 0.3–1.2)
Total Protein: 7.5 g/dL (ref 6.5–8.1)

## 2023-09-10 LAB — CBG MONITORING, ED: Glucose-Capillary: 282 mg/dL — ABNORMAL HIGH (ref 70–99)

## 2023-09-10 LAB — BETA-HYDROXYBUTYRIC ACID: Beta-Hydroxybutyric Acid: 3.71 mmol/L — ABNORMAL HIGH (ref 0.05–0.27)

## 2023-09-10 NOTE — ED Triage Notes (Addendum)
Pt states tired and weak and having feels "off" Has not checked his sugars in few days. He states doctor did not tell him how often to use his home blood glucose monitor so hasn't been using it at all. Reports headaches, chills and diarrhea

## 2023-09-11 NOTE — ED Notes (Signed)
Pt name was called 3x, no answer.

## 2023-09-12 ENCOUNTER — Encounter: Payer: Self-pay | Admitting: Family Medicine

## 2023-09-12 ENCOUNTER — Ambulatory Visit (INDEPENDENT_AMBULATORY_CARE_PROVIDER_SITE_OTHER): Payer: Self-pay | Admitting: Family Medicine

## 2023-09-12 VITALS — BP 87/58 | HR 91 | Ht 66.0 in | Wt 171.6 lb

## 2023-09-12 DIAGNOSIS — R3911 Hesitancy of micturition: Secondary | ICD-10-CM

## 2023-09-12 DIAGNOSIS — Z7985 Long-term (current) use of injectable non-insulin antidiabetic drugs: Secondary | ICD-10-CM

## 2023-09-12 DIAGNOSIS — Z0000 Encounter for general adult medical examination without abnormal findings: Secondary | ICD-10-CM

## 2023-09-12 DIAGNOSIS — R7989 Other specified abnormal findings of blood chemistry: Secondary | ICD-10-CM

## 2023-09-12 DIAGNOSIS — E785 Hyperlipidemia, unspecified: Secondary | ICD-10-CM

## 2023-09-12 DIAGNOSIS — E1165 Type 2 diabetes mellitus with hyperglycemia: Secondary | ICD-10-CM

## 2023-09-12 DIAGNOSIS — I10 Essential (primary) hypertension: Secondary | ICD-10-CM

## 2023-09-12 LAB — MICROALBUMIN, URINE WAIVED
Creatinine, Urine Waived: 50 mg/dL (ref 10–300)
Microalb, Ur Waived: 10 mg/L (ref 0–19)
Microalb/Creat Ratio: <30 mg/g (ref <30)

## 2023-09-12 MED ORDER — LOSARTAN POTASSIUM 50 MG PO TABS: 50.0000 mg | ORAL_TABLET | Freq: Every day | ORAL | 1 refills | Status: DC

## 2023-09-12 NOTE — Progress Notes (Incomplete)
BP (!) 87/58   Pulse 91   Ht 5\' 6"  (1.676 m)   Wt 171 lb 9.6 oz (77.8 kg)   SpO2 98%   BMI 27.70 kg/m    Subjective:    Patient ID: Devin Arias, male    DOB: 1961/11/14, 62 y.o.   MRN: [id number]  HPI: Devin Arias is a 62 y.o. male presenting on 09/12/2023 for comprehensive medical examination. Current medical complaints include:  DIABETES Hypoglycemic episodes:{Blank single:19197::"yes","no"} Polydipsia/polyuria: {Blank single:19197::"yes","no"} Visual disturbance: {Blank single:19197::"yes","no"} Chest pain: {Blank single:19197::"yes","no"} Paresthesias: {Blank single:19197::"yes","no"} Glucose Monitoring: {Blank single:19197::"yes","no"}  Accucheck frequency: {Blank single:19197::"Not Checking","Daily","BID","TID"} Taking Insulin?: {Blank single:19197::"yes","no"} Blood Pressure Monitoring: {Blank single:19197::"not checking","rarely","daily","weekly","monthly","a few times a day","a few times a week","a few times a month"} Retinal Examination: {Blank single:19197::"Up to Date","Not up to Date"} Foot Exam: {Blank single:19197::"Up to Date","Not up to Date"} Diabetic Education: {Blank single:19197::"Completed","Not Completed"} Pneumovax: {Blank single:19197::"Up to Date","Not up to Date","unknown"} Influenza: {Blank single:19197::"Up to Date","Not up to Date","unknown"} Aspirin: {Blank single:19197::"yes","no"}  HYPERTENSION / HYPERLIPIDEMIA Satisfied with current treatment? {Blank single:19197::"yes","no"} Duration of hypertension: {Blank single:19197::"chronic","months","years"} BP monitoring frequency: {Blank single:19197::"not checking","rarely","daily","weekly","monthly","a few times a day","a few times a week","a few times a month"} BP range:  BP medication side effects: {Blank single:19197::"yes","no"} Past BP meds: {Blank multiple:19196::"none","amlodipine","amlodipine/benazepril","atenolol","benazepril","benazepril/HCTZ","bisoprolol  (bystolic)","carvedilol","chlorthalidone","clonidine","diltiazem","exforge HCT","HCTZ","irbesartan (avapro)","labetalol","lisinopril","lisinopril-HCTZ","losartan (cozaar)","methyldopa","nifedipine","olmesartan (benicar)","olmesartan-HCTZ","quinapril","ramipril","spironalactone","tekturna","valsartan","valsartan-HCTZ","verapamil"} Duration of hyperlipidemia: {Blank single:19197::"chronic","months","years"} Cholesterol medication side effects: {Blank single:19197::"yes","no"} Cholesterol supplements: {Blank multiple:19196::"none","fish oil","niacin","red yeast rice"} Past cholesterol medications: {Blank multiple:19196::"none","atorvastain (lipitor)","lovastatin (mevacor)","pravastatin (pravachol)","rosuvastatin (crestor)","simvastatin (zocor)","vytorin","fenofibrate (tricor)","gemfibrozil","ezetimide (zetia)","niaspan","lovaza"} Medication compliance: {Blank single:19197::"excellent compliance","good compliance","fair compliance","poor compliance"} Aspirin: {Blank single:19197::"yes","no"} Recent stressors: {Blank single:19197::"yes","no"} Recurrent headaches: {Blank single:19197::"yes","no"} Visual changes: {Blank single:19197::"yes","no"} Palpitations: {Blank single:19197::"yes","no"} Dyspnea: {Blank single:19197::"yes","no"} Chest pain: {Blank single:19197::"yes","no"} Lower extremity edema: {Blank single:19197::"yes","no"} Dizzy/lightheaded: {Blank single:19197::"yes","no"}  Interim Problems from his last visit: yes  Depression Screen done today and results listed below:     09/12/2023    3:51 PM 12/12/2022    1:10 PM 05/15/2022    8:23 AM 03/27/2022    9:07 AM 02/13/2022   10:18 AM  Depression screen PHQ 2/9  Decreased Interest 3 0 0 0 0  Down, Depressed, Hopeless 3 0 0 0 0  PHQ - 2 Score 6 0 0 0 0  Altered sleeping 3 0 0 0 0  Tired, decreased energy 3 0 0 0 0  Change in appetite 3 0 0 0 0  Feeling bad or failure about yourself  3 0 0 0 0  Trouble concentrating 3 0 0 0 0  Moving  slowly or fidgety/restless 3 0 0 0 0  Suicidal thoughts 3 0 0 0 0  PHQ-9 Score 27 0 0 0 0  Difficult doing work/chores  Not difficult at all Not difficult at all Not difficult at all Not difficult at all    Past Medical History:  Past Medical History:  Diagnosis Date  . Diabetes mellitus without complication (HCC)   . Hypertension     Surgical History:  Past Surgical History:  Procedure Laterality Date  . CHOLECYSTECTOMY    . COLONOSCOPY WITH PROPOFOL N/A 08/02/2021   Procedure: COLONOSCOPY WITH PROPOFOL;  Surgeon: Wyline Mood, MD;  Location: [facility] ENDOSCOPY;  Service: Gastroenterology;  Laterality: N/A;  PATIENT SAYS HE DOES NOT INTERPRETER    Medications:  Current Outpatient Medications on File Prior to Visit  Medication Sig  . amLODipine (NORVASC) 5 MG tablet TAKE ONE TABLET BY MOUTH ONE TIME DAILY  . atorvastatin (LIPITOR) 40 MG tablet TAKE ONE TABLET BY MOUTH ONE TIME DAILY  . Blood Glucose  Monitoring Suppl (ACCU-CHEK AVIVA PLUS) w/Device KIT 1 kit by Does not apply route daily.  . Cholecalciferol (VITAMIN D) 125 MCG (5000 UT) CAPS Take 5,000 Units by mouth daily.  . empagliflozin (JARDIANCE) 25 MG TABS tablet Take 1 tablet (25 mg total) by mouth daily before breakfast.  . gabapentin (NEURONTIN) 100 MG capsule Take 1 capsule (100 mg total) by mouth at bedtime.  Marland Kitchen glucose blood (ACCU-CHEK AVIVA PLUS) test strip Use as instructed  . glucose blood test strip 1 each by Other route daily. Use as instructed  . lidocaine (LIDODERM) 5 % Place 1 patch onto the skin daily. Remove & Discard patch within 12 hours or as directed by MD  . losartan-hydrochlorothiazide (HYZAAR) 100-25 MG tablet Take 1 tablet by mouth daily.  . metFORMIN (GLUCOPHAGE) 1000 MG tablet Take 1 tablet (1,000 mg total) by mouth 2 (two) times daily with a meal.  . Semaglutide, 2 MG/DOSE, 8 MG/3ML SOPN Inject 2 mg as directed once a week.   No current facility-administered medications on file prior to visit.     Allergies:  No Known Allergies  Social History:  Social History   Socioeconomic History  . Marital status: Married    Spouse name: Not on file  . Number of children: Not on file  . Years of education: Not on file  . Highest education level: Not on file  Occupational History  . Not on file  Tobacco Use  . Smoking status: Never  . Smokeless tobacco: Never  Vaping Use  . Vaping status: Never Used  Substance and Sexual Activity  . Alcohol use: Never  . Drug use: Never  . Sexual activity: Yes    Partners: Female  Other Topics Concern  . Not on file  Social History Narrative  . Not on file   Social Determinants of Health   Financial Resource Strain: Not on file  Food Insecurity: Not on file  Transportation Needs: Not on file  Physical Activity: Not on file  Stress: Not on file  Social Connections: Not on file  Intimate Partner Violence: Not on file   Social History   Tobacco Use  Smoking Status Never  Smokeless Tobacco Never   Social History   Substance and Sexual Activity  Alcohol Use Never    Family History:  History reviewed. No pertinent family history.  Past medical history, surgical history, medications, allergies, family history and social history reviewed with patient today and changes made to appropriate areas of the chart.   Review of Systems  Constitutional:  Positive for malaise/fatigue and weight loss. Negative for chills, diaphoresis and fever.  HENT: Negative.    Eyes:  Positive for blurred vision. Negative for double vision, photophobia, pain, discharge and redness.  Respiratory: Negative.    Cardiovascular: Negative.   Gastrointestinal:  Positive for diarrhea (every time he eats). Negative for abdominal pain, blood in stool, constipation, heartburn, melena, nausea and vomiting.  Genitourinary: Negative.   Musculoskeletal: Negative.   Skin: Negative.   Neurological:  Positive for dizziness. Negative for tingling, tremors, sensory  change, speech change, focal weakness, seizures, loss of consciousness, weakness and headaches.  Endo/Heme/Allergies:  Positive for polydipsia. Negative for environmental allergies. Does not bruise/bleed easily.  Psychiatric/Behavioral: Negative.     All other ROS negative except what is listed above and in the HPI.      Objective:    BP (!) 87/58   Pulse 91   Ht 5\' 6"  (1.676 m)   Wt 171 lb 9.6  oz (77.8 kg)   SpO2 98%   BMI 27.70 kg/m   Wt Readings from Last 3 Encounters:  09/12/23 171 lb 9.6 oz (77.8 kg)  09/04/23 185 lb (83.9 kg)  04/15/23 187 lb 8 oz (85 kg)    Physical Exam Vitals and nursing note reviewed.  Constitutional:      General: He is not in acute distress.    Appearance: Normal appearance. He is not ill-appearing, toxic-appearing or diaphoretic.  HENT:     Head: Normocephalic and atraumatic.     Right Ear: Tympanic membrane, ear canal and external ear normal. There is no impacted cerumen.     Left Ear: Tympanic membrane, ear canal and external ear normal. There is no impacted cerumen.     Nose: Nose normal. No congestion or rhinorrhea.     Mouth/Throat:     Mouth: Mucous membranes are moist.     Pharynx: Oropharynx is clear. No oropharyngeal exudate or posterior oropharyngeal erythema.  Eyes:     General: No scleral icterus.       Right eye: No discharge.        Left eye: No discharge.     Extraocular Movements: Extraocular movements intact.     Conjunctiva/sclera: Conjunctivae normal.     Pupils: Pupils are equal, round, and reactive to light.  Neck:     Vascular: No carotid bruit.  Cardiovascular:     Rate and Rhythm: Normal rate and regular rhythm.     Pulses: Normal pulses.     Heart sounds: No murmur heard.    No friction rub. No gallop.  Pulmonary:     Effort: Pulmonary effort is normal. No respiratory distress.     Breath sounds: Normal breath sounds. No stridor. No wheezing, rhonchi or rales.  Chest:     Chest wall: No tenderness.  Abdominal:      General: Abdomen is flat. Bowel sounds are normal. There is no distension.     Palpations: Abdomen is soft. There is no mass.     Tenderness: There is no abdominal tenderness. There is no right CVA tenderness, left CVA tenderness, guarding or rebound.     Hernia: No hernia is present.  Genitourinary:    Comments: Genital exam deferred with shared decision making Musculoskeletal:        General: No swelling, tenderness, deformity or signs of injury.     Cervical back: Normal range of motion and neck supple. No rigidity. No muscular tenderness.     Right lower leg: No edema.     Left lower leg: No edema.  Lymphadenopathy:     Cervical: No cervical adenopathy.  Skin:    General: Skin is warm and dry.     Capillary Refill: Capillary refill takes less than 2 seconds.     Coloration: Skin is not jaundiced or pale.     Findings: No bruising, erythema, lesion or rash.  Neurological:     General: No focal deficit present.     Mental Status: He is alert and oriented to person, place, and time.     Cranial Nerves: No cranial nerve deficit.     Sensory: No sensory deficit.     Motor: No weakness.     Coordination: Coordination normal.     Gait: Gait normal.     Deep Tendon Reflexes: Reflexes normal.  Psychiatric:        Mood and Affect: Mood normal.        Behavior: Behavior normal.  Thought Content: Thought content normal.        Judgment: Judgment normal.     Results for orders placed or performed during the hospital encounter of 09/10/23  Comprehensive metabolic panel  Result Value Ref Range   Sodium 134 (L) 135 - 145 mmol/L   Potassium 3.4 (L) 3.5 - 5.1 mmol/L   Chloride 93 (L) 98 - 111 mmol/L   CO2 24 22 - 32 mmol/L   Glucose, Bld 269 (H) 70 - 99 mg/dL   BUN 21 8 - 23 mg/dL   Creatinine, Ser 1.61 0.61 - 1.24 mg/dL   Calcium 9.4 8.9 - 09.6 mg/dL   Total Protein 7.5 6.5 - 8.1 g/dL   Albumin 4.1 3.5 - 5.0 g/dL   AST 15 15 - 41 U/L   ALT 23 0 - 44 U/L   Alkaline  Phosphatase 86 38 - 126 U/L   Total Bilirubin 1.7 (H) 0.3 - 1.2 mg/dL   GFR, Estimated >04 >54 mL/min   Anion gap 17 (H) 5 - 15  Urinalysis, Routine w reflex microscopic -Urine, Clean Catch  Result Value Ref Range   Color, Urine STRAW (A) YELLOW   APPearance CLEAR CLEAR   Specific Gravity, Urine 1.029 1.005 - 1.030   pH 5.0 5.0 - 8.0   Glucose, UA >=500 (A) NEGATIVE mg/dL   Hgb urine dipstick NEGATIVE NEGATIVE   Bilirubin Urine NEGATIVE NEGATIVE   Ketones, ur 20 (A) NEGATIVE mg/dL   Protein, ur NEGATIVE NEGATIVE mg/dL   Nitrite NEGATIVE NEGATIVE   Leukocytes,Ua NEGATIVE NEGATIVE   RBC / HPF 0-5 0 - 5 RBC/hpf   WBC, UA 0-5 0 - 5 WBC/hpf   Bacteria, UA NONE SEEN NONE SEEN   Squamous Epithelial / HPF 0-5 0 - 5 /HPF   Mucus PRESENT   CBC with Differential  Result Value Ref Range   WBC 8.7 4.0 - 10.5 K/uL   RBC 5.55 4.22 - 5.81 MIL/uL   Hemoglobin 16.2 13.0 - 17.0 g/dL   HCT 09.8 11.9 - 14.7 %   MCV 81.6 80.0 - 100.0 fL   MCH 29.2 26.0 - 34.0 pg   MCHC 35.8 30.0 - 36.0 g/dL   RDW 82.9 56.2 - 13.0 %   Platelets 257 150 - 400 K/uL   nRBC 0.0 0.0 - 0.2 %   Neutrophils Relative % 61 %   Neutro Abs 5.4 1.7 - 7.7 K/uL   Lymphocytes Relative 27 %   Lymphs Abs 2.4 0.7 - 4.0 K/uL   Monocytes Relative 8 %   Monocytes Absolute 0.7 0.1 - 1.0 K/uL   Eosinophils Relative 2 %   Eosinophils Absolute 0.2 0.0 - 0.5 K/uL   Basophils Relative 1 %   Basophils Absolute 0.0 0.0 - 0.1 K/uL   Immature Granulocytes 1 %   Abs Immature Granulocytes 0.04 0.00 - 0.07 K/uL  Beta-hydroxybutyric acid  Result Value Ref Range   Beta-Hydroxybutyric Acid 3.71 (H) 0.05 - 0.27 mmol/L  CBG monitoring, ED  Result Value Ref Range   Glucose-Capillary 282 (H) 70 - 99 mg/dL      Assessment & Plan:   Problem List Items Addressed This Visit       Cardiovascular and Mediastinum   Primary hypertension     Endocrine   Uncontrolled type 2 diabetes mellitus with hyperglycemia (HCC)     Other    Hyperlipidemia - Primary   Low vitamin D level   Other Visit Diagnoses     Hesitancy  LABORATORY TESTING:  Health maintenance labs ordered today as discussed above.   The natural history of prostate cancer and ongoing controversy regarding screening and potential treatment outcomes of prostate cancer has been discussed with the patient. The meaning of a false positive PSA and a false negative PSA has been discussed. He indicates understanding of the limitations of this screening test and wishes to proceed with screening PSA testing.   IMMUNIZATIONS:   - Tdap: Tetanus vaccination status reviewed: {tetanus status:315746}. - Influenza: {Blank single:19197::"Up to date","Administered today","Postponed to flu season","Refused","Given elsewhere"} - Pneumovax: {Blank single:19197::"Up to date","Administered today","Not applicable","Refused","Given elsewhere"} - Prevnar: {Blank single:19197::"Up to date","Administered today","Not applicable","Refused","Given elsewhere"} - COVID: {Blank single:19197::"Up to date","Administered today","Not applicable","Refused","Given elsewhere"} - HPV: {Blank single:19197::"Up to date","Administered today","Not applicable","Refused","Given elsewhere"} - Shingrix vaccine: {Blank single:19197::"Up to date","Administered today","Not applicable","Refused","Given elsewhere"}  SCREENING: - Colonoscopy: Not applicable  Discussed with patient purpose of the colonoscopy is to detect colon cancer at curable precancerous or early stages   PATIENT COUNSELING:    Sexuality: Discussed sexually transmitted diseases, partner selection, use of condoms, avoidance of unintended pregnancy  and contraceptive alternatives.   Advised to avoid cigarette smoking.  I discussed with the patient that most people either abstain from alcohol or drink within safe limits (<=14/week and <=4 drinks/occasion for males, <=7/weeks and <= 3 drinks/occasion for females) and that the risk  for alcohol disorders and other health effects rises proportionally with the number of drinks per week and how often a drinker exceeds daily limits.  Discussed cessation/primary prevention of drug use and availability of treatment for abuse.   Diet: Encouraged to adjust caloric intake to maintain  or achieve ideal body weight, to reduce intake of dietary saturated fat and total fat, to limit sodium intake by avoiding high sodium foods and not adding table salt, and to maintain adequate dietary potassium and calcium preferably from fresh fruits, vegetables, and low-fat dairy products.    stressed the importance of regular exercise  Injury prevention: Discussed safety belts, safety helmets, smoke detector, smoking near bedding or upholstery.   Dental health: Discussed importance of regular tooth brushing, flossing, and dental visits.   Follow up plan: NEXT PREVENTATIVE PHYSICAL DUE IN 1 YEAR. No follow-ups on file.

## 2023-09-13 ENCOUNTER — Encounter: Payer: Self-pay | Admitting: Family Medicine

## 2023-09-13 NOTE — Assessment & Plan Note (Signed)
Under good control on current regimen. Continue current regimen. Continue to monitor. Call with any concerns. Refills given. Labs drawn today.

## 2023-09-13 NOTE — Assessment & Plan Note (Signed)
Rechecking labs today. Await results. Treat as needed.  °

## 2023-09-13 NOTE — Assessment & Plan Note (Signed)
Sugars were very high at ER. Will check A1c today. Await results and treat as needed. Having significant diarrhea on ozempic. May need to changes medicine. Continue to monitor.

## 2023-09-13 NOTE — Progress Notes (Signed)
BP (!) 87/58   Pulse 91   Ht 5\' 6"  (1.676 m)   Wt 171 lb 9.6 oz (77.8 kg)   SpO2 98%   BMI 27.70 kg/m    Subjective:    Patient ID: Devin Arias, male    DOB: 01/24/1961, 62 y.o.   MRN: [id number]  HPI: LEONIDES MINDER is a 62 y.o. male presenting on 09/12/2023 for comprehensive medical examination. Current medical complaints include:  DIABETES Hypoglycemic episodes:no Polydipsia/polyuria: yes Visual disturbance: yes Chest pain: no Paresthesias: yes Glucose Monitoring: yes  Accucheck frequency:  occasionally Taking Insulin?: no Blood Pressure Monitoring: not checking Retinal Examination: Up to Date Foot Exam: Up to Date Diabetic Education: Completed Pneumovax: Up to Date Influenza: Not up to Date Aspirin: no  HYPERTENSION / HYPERLIPIDEMIA Satisfied with current treatment? no Duration of hypertension: chronic BP monitoring frequency: not checking BP medication side effects: unclear Past BP meds: losartan-HCTZ Duration of hyperlipidemia: chronic Cholesterol medication side effects: no Cholesterol supplements: none Past cholesterol medications: atorvastatin Medication compliance: excellent compliance Aspirin: no Recent stressors: no Recurrent headaches: no Visual changes: no Palpitations: no Dyspnea: no Chest pain: no Lower extremity edema: no Dizzy/lightheaded: no  Interim Problems from his last visit: yes  Depression Screen done today and results listed below:     09/12/2023    3:51 PM 12/12/2022    1:10 PM 05/15/2022    8:23 AM 03/27/2022    9:07 AM 02/13/2022   10:18 AM  Depression screen PHQ 2/9  Decreased Interest 3 0 0 0 0  Down, Depressed, Hopeless 3 0 0 0 0  PHQ - 2 Score 6 0 0 0 0  Altered sleeping 3 0 0 0 0  Tired, decreased energy 3 0 0 0 0  Change in appetite 3 0 0 0 0  Feeling bad or failure about yourself  3 0 0 0 0  Trouble concentrating 3 0 0 0 0  Moving slowly or fidgety/restless 3 0 0 0 0  Suicidal thoughts 3 0 0 0 0   PHQ-9 Score 27 0 0 0 0  Difficult doing work/chores  Not difficult at all Not difficult at all Not difficult at all Not difficult at all    Past Medical History:  Past Medical History:  Diagnosis Date   Diabetes mellitus without complication (HCC)    Hypertension     Surgical History:  Past Surgical History:  Procedure Laterality Date   CHOLECYSTECTOMY     COLONOSCOPY WITH PROPOFOL N/A 08/02/2021   Procedure: COLONOSCOPY WITH PROPOFOL;  Surgeon: Wyline Mood, MD;  Location: [facility] ENDOSCOPY;  Service: Gastroenterology;  Laterality: N/A;  PATIENT SAYS HE DOES NOT INTERPRETER    Medications:  Current Outpatient Medications on File Prior to Visit  Medication Sig   amLODipine (NORVASC) 5 MG tablet TAKE ONE TABLET BY MOUTH ONE TIME DAILY   atorvastatin (LIPITOR) 40 MG tablet TAKE ONE TABLET BY MOUTH ONE TIME DAILY   Blood Glucose Monitoring Suppl (ACCU-CHEK AVIVA PLUS) w/Device KIT 1 kit by Does not apply route daily.   Cholecalciferol (VITAMIN D) 125 MCG (5000 UT) CAPS Take 5,000 Units by mouth daily.   empagliflozin (JARDIANCE) 25 MG TABS tablet Take 1 tablet (25 mg total) by mouth daily before breakfast.   gabapentin (NEURONTIN) 100 MG capsule Take 1 capsule (100 mg total) by mouth at bedtime.   glucose blood (ACCU-CHEK AVIVA PLUS) test strip Use as instructed   glucose blood test strip 1 each by Other route daily. Use  as instructed   lidocaine (LIDODERM) 5 % Place 1 patch onto the skin daily. Remove & Discard patch within 12 hours or as directed by MD   metFORMIN (GLUCOPHAGE) 1000 MG tablet Take 1 tablet (1,000 mg total) by mouth 2 (two) times daily with a meal.   Semaglutide, 2 MG/DOSE, 8 MG/3ML SOPN Inject 2 mg as directed once a week.   No current facility-administered medications on file prior to visit.    Allergies:  No Known Allergies  Social History:  Social History   Socioeconomic History   Marital status: Married    Spouse name: Not on file   Number of children: Not  on file   Years of education: Not on file   Highest education level: Not on file  Occupational History   Not on file  Tobacco Use   Smoking status: Never   Smokeless tobacco: Never  Vaping Use   Vaping status: Never Used  Substance and Sexual Activity   Alcohol use: Never   Drug use: Never   Sexual activity: Yes    Partners: Female  Other Topics Concern   Not on file  Social History Narrative   Not on file   Social Determinants of Health   Financial Resource Strain: Not on file  Food Insecurity: Not on file  Transportation Needs: Not on file  Physical Activity: Not on file  Stress: Not on file  Social Connections: Not on file  Intimate Partner Violence: Not on file   Social History   Tobacco Use  Smoking Status Never  Smokeless Tobacco Never   Social History   Substance and Sexual Activity  Alcohol Use Never    Family History:  History reviewed. No pertinent family history.  Past medical history, surgical history, medications, allergies, family history and social history reviewed with patient today and changes made to appropriate areas of the chart.   Review of Systems  Constitutional:  Positive for malaise/fatigue and weight loss. Negative for chills, diaphoresis and fever.  HENT: Negative.    Eyes:  Positive for blurred vision. Negative for double vision, photophobia, pain, discharge and redness.  Respiratory: Negative.    Cardiovascular: Negative.   Gastrointestinal:  Positive for diarrhea (every time he eats). Negative for abdominal pain, blood in stool, constipation, heartburn, melena, nausea and vomiting.  Genitourinary: Negative.   Musculoskeletal: Negative.   Skin: Negative.   Neurological:  Positive for dizziness. Negative for tingling, tremors, sensory change, speech change, focal weakness, seizures, loss of consciousness, weakness and headaches.  Endo/Heme/Allergies:  Positive for polydipsia. Negative for environmental allergies. Does not  bruise/bleed easily.  Psychiatric/Behavioral: Negative.     All other ROS negative except what is listed above and in the HPI.      Objective:    BP (!) 87/58   Pulse 91   Ht 5\' 6"  (1.676 m)   Wt 171 lb 9.6 oz (77.8 kg)   SpO2 98%   BMI 27.70 kg/m   Wt Readings from Last 3 Encounters:  09/12/23 171 lb 9.6 oz (77.8 kg)  09/04/23 185 lb (83.9 kg)  04/15/23 187 lb 8 oz (85 kg)    Physical Exam Vitals and nursing note reviewed.  Constitutional:      General: He is not in acute distress.    Appearance: Normal appearance. He is not ill-appearing, toxic-appearing or diaphoretic.  HENT:     Head: Normocephalic and atraumatic.     Right Ear: Tympanic membrane, ear canal and external ear normal. There is  no impacted cerumen.     Left Ear: Tympanic membrane, ear canal and external ear normal. There is no impacted cerumen.     Nose: Nose normal. No congestion or rhinorrhea.     Mouth/Throat:     Mouth: Mucous membranes are moist.     Pharynx: Oropharynx is clear. No oropharyngeal exudate or posterior oropharyngeal erythema.  Eyes:     General: No scleral icterus.       Right eye: No discharge.        Left eye: No discharge.     Extraocular Movements: Extraocular movements intact.     Conjunctiva/sclera: Conjunctivae normal.     Pupils: Pupils are equal, round, and reactive to light.  Neck:     Vascular: No carotid bruit.  Cardiovascular:     Rate and Rhythm: Normal rate and regular rhythm.     Pulses: Normal pulses.     Heart sounds: No murmur heard.    No friction rub. No gallop.  Pulmonary:     Effort: Pulmonary effort is normal. No respiratory distress.     Breath sounds: Normal breath sounds. No stridor. No wheezing, rhonchi or rales.  Chest:     Chest wall: No tenderness.  Abdominal:     General: Abdomen is flat. Bowel sounds are normal. There is no distension.     Palpations: Abdomen is soft. There is no mass.     Tenderness: There is no abdominal tenderness. There is  no right CVA tenderness, left CVA tenderness, guarding or rebound.     Hernia: No hernia is present.  Genitourinary:    Comments: Genital exam deferred with shared decision making Musculoskeletal:        General: No swelling, tenderness, deformity or signs of injury.     Cervical back: Normal range of motion and neck supple. No rigidity. No muscular tenderness.     Right lower leg: No edema.     Left lower leg: No edema.  Lymphadenopathy:     Cervical: No cervical adenopathy.  Skin:    General: Skin is warm and dry.     Capillary Refill: Capillary refill takes less than 2 seconds.     Coloration: Skin is not jaundiced or pale.     Findings: No bruising, erythema, lesion or rash.  Neurological:     General: No focal deficit present.     Mental Status: He is alert and oriented to person, place, and time.     Cranial Nerves: No cranial nerve deficit.     Sensory: No sensory deficit.     Motor: No weakness.     Coordination: Coordination normal.     Gait: Gait normal.     Deep Tendon Reflexes: Reflexes normal.  Psychiatric:        Mood and Affect: Mood normal.        Behavior: Behavior normal.        Thought Content: Thought content normal.        Judgment: Judgment normal.     Results for orders placed or performed in visit on 09/12/23  Comprehensive metabolic panel  Result Value Ref Range   Glucose 320 (H) 70 - 99 mg/dL   BUN 22 8 - 27 mg/dL   Creatinine, Ser 1.61 0.76 - 1.27 mg/dL   eGFR 73 >09 UE/AVW/0.98   BUN/Creatinine Ratio 19 10 - 24   Sodium 133 (L) 134 - 144 mmol/L   Potassium 3.1 (L) 3.5 - 5.2 mmol/L   Chloride 90 (  L) 96 - 106 mmol/L   CO2 18 (L) 20 - 29 mmol/L   Calcium 9.4 8.6 - 10.2 mg/dL   Total Protein 7.2 6.0 - 8.5 g/dL   Albumin 4.5 3.9 - 4.9 g/dL   Globulin, Total 2.7 1.5 - 4.5 g/dL   Bilirubin Total 0.9 0.0 - 1.2 mg/dL   Alkaline Phosphatase 105 44 - 121 IU/L   AST 24 0 - 40 IU/L   ALT 29 0 - 44 IU/L  CBC with Differential/Platelet  Result Value  Ref Range   WBC WILL FOLLOW    RBC WILL FOLLOW    Hemoglobin WILL FOLLOW    Hematocrit WILL FOLLOW    MCV WILL FOLLOW    MCH WILL FOLLOW    MCHC WILL FOLLOW    RDW WILL FOLLOW    Platelets WILL FOLLOW    Neutrophils WILL FOLLOW    Lymphs WILL FOLLOW    Monocytes WILL FOLLOW    Eos WILL FOLLOW    Basos WILL FOLLOW    Immature Cells WILL FOLLOW    Neutrophils Absolute WILL FOLLOW    Lymphocytes Absolute WILL FOLLOW    Monocytes Absolute WILL FOLLOW    EOS (ABSOLUTE) WILL FOLLOW    Basophils Absolute WILL FOLLOW    Immature Granulocytes WILL FOLLOW    Immature Grans (Abs) WILL FOLLOW    NRBC WILL FOLLOW    Hematology Comments: WILL FOLLOW   Lipid Panel w/o Chol/HDL Ratio  Result Value Ref Range   Cholesterol, Total 94 (L) 100 - 199 mg/dL   Triglycerides 161 (H) 0 - 149 mg/dL   HDL 41 >09 mg/dL   VLDL Cholesterol Cal 26 5 - 40 mg/dL   LDL Chol Calc (NIH) 27 0 - 99 mg/dL  PSA  Result Value Ref Range   Prostate Specific Ag, Serum 0.3 0.0 - 4.0 ng/mL  TSH  Result Value Ref Range   TSH 1.320 0.450 - 4.500 uIU/mL  Microalbumin, Urine Waived  Result Value Ref Range   Microalb, Ur Waived 10 0 - 19 mg/L   Creatinine, Urine Waived 50 10 - 300 mg/dL   Microalb/Creat Ratio <30 <30 mg/g  Hgb A1c w/o eAG  Result Value Ref Range   Hgb A1c MFr Bld WILL FOLLOW   VITAMIN D 25 Hydroxy (Vit-D Deficiency, Fractures)  Result Value Ref Range   Vit D, 25-Hydroxy 34.0 30.0 - 100.0 ng/mL  HIV Antibody (routine testing w rflx)  Result Value Ref Range   HIV Screen 4th Generation wRfx Non Reactive Non Reactive      Assessment & Plan:   Problem List Items Addressed This Visit       Cardiovascular and Mediastinum   Primary hypertension    Running very low. Stop hydrochlorothiazide and cut his losartan in 1/2. Recheck 2 weeks. Call with any concerns.       Relevant Medications   losartan (COZAAR) 50 MG tablet     Endocrine   Uncontrolled type 2 diabetes mellitus with hyperglycemia  (HCC)    Sugars were very high at ER. Will check A1c today. Await results and treat as needed. Having significant diarrhea on ozempic. May need to changes medicine. Continue to monitor.       Relevant Medications   losartan (COZAAR) 50 MG tablet     Other   Hyperlipidemia    Under good control on current regimen. Continue current regimen. Continue to monitor. Call with any concerns. Refills given. Labs drawn today.  Relevant Medications   losartan (COZAAR) 50 MG tablet   Low vitamin D level    Rechecking labs today. Await results. Treat as needed.       Other Visit Diagnoses     Routine general medical examination at a health care facility    -  Primary   Vaccines up to date. Screening labs checked today. Colonoscopy up to date. Continue diet and exercise. Call with any concerns.   Relevant Orders   Comprehensive metabolic panel (Completed)   CBC with Differential/Platelet (Completed)   Lipid Panel w/o Chol/HDL Ratio (Completed)   PSA (Completed)   TSH (Completed)   Microalbumin, Urine Waived (Completed)   Hgb A1c w/o eAG (Completed)   VITAMIN D 25 Hydroxy (Vit-D Deficiency, Fractures) (Completed)   HIV Antibody (routine testing w rflx) (Completed)   Hesitancy       Checking labs today. Await results.       LABORATORY TESTING:  Health maintenance labs ordered today as discussed above.   The natural history of prostate cancer and ongoing controversy regarding screening and potential treatment outcomes of prostate cancer has been discussed with the patient. The meaning of a false positive PSA and a false negative PSA has been discussed. He indicates understanding of the limitations of this screening test and wishes to proceed with screening PSA testing.   IMMUNIZATIONS:   - Tdap: Tetanus vaccination status reviewed: last tetanus booster within 10 years. - Influenza:  Will discuss next visit - Pneumovax: Up to date - Prevnar: Up to date - COVID: Up to date - HPV:  Not applicable - Shingrix vaccine: Refused  SCREENING: - Colonoscopy: Not applicable  Discussed with patient purpose of the colonoscopy is to detect colon cancer at curable precancerous or early stages   PATIENT COUNSELING:    Sexuality: Discussed sexually transmitted diseases, partner selection, use of condoms, avoidance of unintended pregnancy  and contraceptive alternatives.   Advised to avoid cigarette smoking.  I discussed with the patient that most people either abstain from alcohol or drink within safe limits (<=14/week and <=4 drinks/occasion for males, <=7/weeks and <= 3 drinks/occasion for females) and that the risk for alcohol disorders and other health effects rises proportionally with the number of drinks per week and how often a drinker exceeds daily limits.  Discussed cessation/primary prevention of drug use and availability of treatment for abuse.   Diet: Encouraged to adjust caloric intake to maintain  or achieve ideal body weight, to reduce intake of dietary saturated fat and total fat, to limit sodium intake by avoiding high sodium foods and not adding table salt, and to maintain adequate dietary potassium and calcium preferably from fresh fruits, vegetables, and low-fat dairy products.    stressed the importance of regular exercise  Injury prevention: Discussed safety belts, safety helmets, smoke detector, smoking near bedding or upholstery.   Dental health: Discussed importance of regular tooth brushing, flossing, and dental visits.   Follow up plan: NEXT PREVENTATIVE PHYSICAL DUE IN 1 YEAR. Return in about 3 weeks (around 10/03/2023).

## 2023-09-13 NOTE — Assessment & Plan Note (Signed)
Running very low. Stop hydrochlorothiazide and cut his losartan in 1/2. Recheck 2 weeks. Call with any concerns.

## 2023-09-15 ENCOUNTER — Other Ambulatory Visit: Payer: Self-pay | Admitting: Family Medicine

## 2023-09-15 ENCOUNTER — Other Ambulatory Visit: Payer: Self-pay

## 2023-09-15 DIAGNOSIS — E1165 Type 2 diabetes mellitus with hyperglycemia: Secondary | ICD-10-CM

## 2023-09-15 LAB — CBC WITH DIFFERENTIAL/PLATELET
Basos: 0 %
EOS (ABSOLUTE): 0 10*3/uL (ref 0.0–0.2)
Eos: 2 %
Hematocrit: 49.8 % (ref 37.5–51.0)
Hemoglobin: 18.4 g/dL — ABNORMAL HIGH (ref 13.0–17.7)
Immature Granulocytes: 0 %
Immature Granulocytes: 0 10*3/uL (ref 0.0–0.1)
Lymphs: 22 %
MCH: 31.9 pg (ref 26.6–33.0)
MCHC: 36.9 g/dL — ABNORMAL HIGH (ref 31.5–35.7)
MCV: 86 fL (ref 79–97)
Monocytes Absolute: 0.2 10*3/uL (ref 0.0–0.4)
Monocytes Absolute: 0.7 10*3/uL (ref 0.1–0.9)
Monocytes: 8 %
Neutrophils Absolute: 2.1 10*3/uL (ref 0.7–3.1)
Neutrophils Absolute: 6.3 10*3/uL (ref 1.4–7.0)
Neutrophils: 68 %
Platelets: 304 10*3/uL (ref 150–450)
RBC: 5.77 x10E6/uL (ref 4.14–5.80)
RDW: 13.2 % (ref 11.6–15.4)
WBC: 9.4 10*3/uL (ref 3.4–10.8)

## 2023-09-15 LAB — LIPID PANEL W/O CHOL/HDL RATIO
Cholesterol, Total: 94 mg/dL — ABNORMAL LOW (ref 100–199)
HDL: 41 mg/dL (ref >39)
LDL Chol Calc (NIH): 27 mg/dL (ref 0–99)
Triglycerides: 158 mg/dL — ABNORMAL HIGH (ref 0–149)
VLDL Cholesterol Cal: 26 mg/dL (ref 5–40)

## 2023-09-15 LAB — COMPREHENSIVE METABOLIC PANEL
ALT: 29 [IU]/L (ref 0–44)
AST: 24 [IU]/L (ref 0–40)
Albumin: 4.5 g/dL (ref 3.9–4.9)
Alkaline Phosphatase: 105 [IU]/L (ref 44–121)
BUN/Creatinine Ratio: 19 (ref 10–24)
BUN: 22 mg/dL (ref 8–27)
Bilirubin Total: 0.9 mg/dL (ref 0.0–1.2)
CO2: 18 mmol/L — ABNORMAL LOW (ref 20–29)
Calcium: 9.4 mg/dL (ref 8.6–10.2)
Chloride: 90 mmol/L — ABNORMAL LOW (ref 96–106)
Creatinine, Ser: 1.14 mg/dL (ref 0.76–1.27)
Globulin, Total: 2.7 g/dL (ref 1.5–4.5)
Glucose: 320 mg/dL — ABNORMAL HIGH (ref 70–99)
Potassium: 3.1 mmol/L — ABNORMAL LOW (ref 3.5–5.2)
Sodium: 133 mmol/L — ABNORMAL LOW (ref 134–144)
Total Protein: 7.2 g/dL (ref 6.0–8.5)
eGFR: 73 mL/min/{1.73_m2} (ref >59)

## 2023-09-15 LAB — HGB A1C W/O EAG: Hgb A1c MFr Bld: 14.6 % — ABNORMAL HIGH (ref 4.8–5.6)

## 2023-09-15 LAB — PSA: Prostate Specific Ag, Serum: 0.3 ng/mL (ref 0.0–4.0)

## 2023-09-15 LAB — HIV ANTIBODY (ROUTINE TESTING W REFLEX): HIV Screen 4th Generation wRfx: NONREACTIVE

## 2023-09-15 LAB — TSH: TSH: 1.32 u[IU]/mL (ref 0.450–4.500)

## 2023-09-15 LAB — VITAMIN D 25 HYDROXY (VIT D DEFICIENCY, FRACTURES): Vit D, 25-Hydroxy: 34 ng/mL (ref 30.0–100.0)

## 2023-09-15 MED ORDER — TOUJEO SOLOSTAR 300 UNIT/ML ~~LOC~~ SOPN: 20.0000 [IU] | PEN_INJECTOR | Freq: Every day | SUBCUTANEOUS | 1 refills | Status: DC

## 2023-09-15 NOTE — Telephone Encounter (Signed)
Requested Prescriptions  Pending Prescriptions Disp Refills   insulin glargine, 1 Unit Dial, (TOUJEO SOLOSTAR) 300 UNIT/ML Solostar Pen 1.5 mL 1    Sig: Inject 20 Units into the skin at bedtime.     Endocrinology:  Diabetes - Insulins Failed - 09/15/2023  3:24 PM      Failed - HBA1C is between 0 and 7.9 and within 180 days    HB A1C (BAYER DCA - WAIVED)  Date Value Ref Range Status  02/12/2023 6.6 (H) 4.8 - 5.6 % Final    Comment:             Prediabetes: 5.7 - 6.4          Diabetes: >6.4          Glycemic control for adults with diabetes: <7.0    Hgb A1c MFr Bld  Date Value Ref Range Status  09/12/2023 14.6 (H) 4.8 - 5.6 % Final    Comment:             Prediabetes: 5.7 - 6.4          Diabetes: >6.4          Glycemic control for adults with diabetes: <7.0          Passed - Valid encounter within last 6 months    Recent Outpatient Visits           3 days ago Routine general medical examination at a health care facility   [facility] [place] P, DO   7 months ago Uncontrolled type 2 diabetes mellitus with hyperglycemia [facility])   [place] [facility] [place], [name], DO   9 months ago Diarrhea, unspecified type   [place] [facility] [place], [name], DO   10 months ago Uncontrolled type 2 diabetes mellitus with hyperglycemia [facility])   [place] [facility] [place], [name], DO   1 year ago Uncontrolled type 2 diabetes mellitus with hyperglycemia [facility])   [place] [facility] [place], [name], PA-C

## 2023-09-15 NOTE — Progress Notes (Signed)
Please let him know that his potassium is quite low and his A1c is quite high. Id like him to stop his ozempic as it's likely giving him the diarrhea and not helping and we are going to get him started on insulin at night. Make sure he's taking his sugar every morning and writing it down and I'm going to send in 20 units of insulin to be taken at bedtime. I'm also going to have [place] call him.   Please also schedule him a virtual visit in 1 week to discuss his sugars. (Do not cancel next appt)

## 2023-09-15 NOTE — Telephone Encounter (Signed)
Pt given lab results per notes of Dr.[name] on 09/15/23. Pt verbalized understanding. Patient would like the insulin sent to [facility] pharmacy in [facility] [address]. He stated he will callback to schedule the virtual visit.  [name], DO 09/15/2023  1:43 PM EDT Back to Top    Please let him know that his potassium is quite low and his A1c is quite high. Id like him to stop his ozempic as it's likely giving him the diarrhea and not helping and we are going to get him started on insulin at night. Make sure he's taking his sugar every morning and writing it down and I'm going to send in 20 units of insulin to be taken at bedtime. I'm also going to have [place] call him.   Please also schedule him a virtual visit in 1 week to discuss his sugars. (Do not cancel next appt)

## 2023-09-18 ENCOUNTER — Telehealth: Payer: Self-pay

## 2023-09-18 NOTE — Progress Notes (Signed)
   Care Guide Note  09/18/2023 Name: JERAMI TAMMEN MRN: [id number] DOB: [birth year]  Referred by: [name], DO Reason for referral : Care Coordination (Outreach to schedule with pharm d )   NAMEER SUMMER is a 62 y.o. year old male who is a primary care patient of [name], DO. FINNIS COLEE was referred to the pharmacist for assistance related to DM.    An unsuccessful telephone outreach was attempted today to contact the patient who was referred to the pharmacy team for assistance with medication management. Additional attempts will be made to contact the patient.   Penne Lash, RMA Care Guide [facility]  [place], [place] [address] Direct Dial: [phone] [email or web address]

## 2023-09-23 ENCOUNTER — Other Ambulatory Visit: Payer: Self-pay

## 2023-09-23 NOTE — Progress Notes (Signed)
09/23/2023 Name: Devin Arias MRN: [id number] DOB: Sep 25, 1961  Chief Complaint  Patient presents with   Diabetes Management Plan   Devin Arias is a 62 y.o. year old male who presented for a telephone visit.   They were referred to the pharmacist by their PCP for assistance in managing diabetes.   Subjective:  Care Team: Primary Care Provider: [name], DO ; Next Scheduled Visit: 10/09/23  Medication Access/Adherence Current Pharmacy:  [facility] [address] [facility] - [place], [place] - [address] [facility] [address] [facility] [address] [place] [address] Phone: [phone] Fax: [phone]  [id number] [place], [place] - [address] [place] [address] Phone: [phone] Fax: [phone]  [facility] - [facility] [address] [place] [address] Phone: [phone] Fax: [phone]  [facility] [address] [place], [place] - [address] AT [facility] [address] [place] [address] Phone: [phone] Fax: [phone]  -Patient reports affordability concerns with their medications: Yes  -Patient reports access/transportation concerns to their pharmacy: No  -Patient reports adherence concerns with their medications:  Yes    Diabetes: Current medications: Jardiance 25mg  daily, metformin 1000mg  BID, Toujeo 20 units at bedtime -Medications tried in the past: Ozempic caused diarrhea and was recently stopped due to hypokalemia potentially caused from diarrhea.  -Patient states diarrhea has resolved since Ozempic stopped- added to allergies as intolerance -Recently hospitalized and followed up with PCP, Devin Arias- A1c at this 10/16 visit was highly elevated at 14.6% -Patient checks FBG daily -Current glucose readings: FBG down to 120-140 from 240 when he first started Toujeo approximately 5-6 days ago -Patient denies any  hypoglycemic s/sx including dizziness, shakiness, sweating -Patient currently does not have any prescription drug insurance.  He previously had full Medicaid benefits, but this was recently reduced to only family planning.   -Currently has enough Jardiance 25mg  and Toujeo to last him through the end of the month but will not be able to afford refills at this time -Diabetic test strips will also be too expensive for him to afford without insurance coverage  Hypertension: Current medications: losartan 50mg  daily, amlodipine 5mg  daily  -Medications previously tried: hydrochlorothiazide recently stopped due to hypotension -Patient has a validated, automated, upper arm home BP cuff -Current blood pressure readings readings: 107-133/73-74  Objective: Lab Results  Component Value Date   HGBA1C 14.6 (H) 09/12/2023   Lab Results  Component Value Date   CREATININE 1.14 09/12/2023   BUN 22 09/12/2023   NA 133 (L) 09/12/2023   K 3.1 (L) 09/12/2023   CL 90 (L) 09/12/2023   CO2 18 (L) 09/12/2023   Medications Reviewed Today     Reviewed by Lenna Gilford, RPH (Pharmacist) on 09/23/23 at 1152  Med List Status: <None>   Medication Order Taking? Sig Documenting Provider Last Dose Status Informant  amLODipine (NORVASC) 5 MG tablet [id number] Yes TAKE ONE TABLET BY MOUTH ONE TIME DAILY Laural [place], [name], DO Taking Active   atorvastatin (LIPITOR) 40 MG tablet [id number] Yes TAKE ONE TABLET BY MOUTH ONE TIME DAILY [name], [name], DO Taking Active   Blood Glucose Monitoring Suppl (ACCU-CHEK AVIVA PLUS) w/Device KIT [id number] Yes 1 kit by Does not apply route daily. [name], [name], DO Taking Active   Cholecalciferol (VITAMIN D) 125 MCG (5000 UT) CAPS [id number] Yes Take 5,000 Units by mouth daily. [provider] Taking Active   empagliflozin (JARDIANCE) 25 MG TABS tablet [id number] Yes Take 1 tablet (25 mg total) by mouth daily before breakfast. [name], MD Taking Active   glucose  blood (ACCU-CHEK AVIVA PLUS) test strip [id number] Yes Use as instructed [name], DO Taking Active   glucose blood test strip [id number] Yes 1 each by Other route daily. Use as instructed [name], NP Taking Active   insulin glargine, 1 Unit Dial, (TOUJEO SOLOSTAR) 300 UNIT/ML Solostar Pen [id number] Yes Inject 20 Units into the skin at bedtime. [name], [name], DO Taking Active   lidocaine (LIDODERM) 5 % [id number] Yes Place 1 patch onto the skin daily. Remove & Discard patch within 12 hours or as directed by MD [name], DO Taking Active   losartan (COZAAR) 50 MG tablet [id number] Yes Take 1 tablet (50 mg total) by mouth daily. [name], DO Taking Active   metFORMIN (GLUCOPHAGE) 1000 MG tablet [id number] Yes Take 1 tablet (1,000 mg total) by mouth 2 (two) times daily with a meal. Mecum, [name], PA-C Taking Active            Assessment/Plan:   Diabetes: - Currently uncontrolled, but appears to be improving with newly started basal insulin based on home FBG - Reviewed goal A1c, goal fasting, and goal 2 hour post prandial glucose - Recommend to continue current regimen at this time  - Recommend to check glucose daily and record - Advised patient that most cost-effective testing supplies without insurance are likely True Metrix or Embrace, which can be purchased OTC at [place] or on [employer] for approximately $20-30/month - Meets financial criteria for [employer] patient assistance through [employer] and [employer] patient assistance through [facility]; will coordinate with medication assistance team to initiate application process - Will see if [facility] happens to have SGLT2 or Long-acting insulin sample(s) patient can get in the meantime  Hypertension: - Currently controlled - Reviewed appropriate blood pressure monitoring technique and reviewed goal blood pressure. Recommended to check home blood pressure and heart rate daily and record - Recommend to continue current regimen at  this time   Follow Up Plan: Will notify patient regarding availability of samples, monitor progress of PAP, and schedule appropriate follow-up  Lenna Gilford, PharmD, DPLA

## 2023-09-24 ENCOUNTER — Other Ambulatory Visit: Payer: Self-pay

## 2023-09-24 ENCOUNTER — Ambulatory Visit: Payer: Medicaid Other

## 2023-09-24 DIAGNOSIS — Z23 Encounter for immunization: Secondary | ICD-10-CM

## 2023-09-24 NOTE — Progress Notes (Signed)
   09/24/2023  Patient ID: Devin [place], male   DOB: 18-Jan-1961, 62 y.o.   MRN: [id number]  Patient outreach to inform Devin [place] that [facility] does have Toujeo samples that Dr. [name] has signed out for him.  He plans to come by tomorrow (10/24) to pick these up to use while we work on PAP for him.  Office does not have samples of any SGLT2's.  Dr. [name] would like regular PharmD follow-up to check on BG control, so I have scheduled a telephone visit with him again in 2 weeks.  Lenna Gilford, PharmD, DPLA

## 2023-10-03 ENCOUNTER — Other Ambulatory Visit: Payer: Self-pay

## 2023-10-03 ENCOUNTER — Other Ambulatory Visit: Payer: Self-pay | Admitting: Family Medicine

## 2023-10-03 NOTE — Telephone Encounter (Signed)
Prescription Request  10/03/2023  LOV: Visit date not found  What is the name of the medication or equipment? (JARDIANCE) 25 MG   Have you contacted your pharmacy to request a refill? Yes   Which pharmacy would you like this sent to? [facility], [place], [place]    Patient notified that their request is being sent to the clinical staff for review and that they should receive a response within 2 business days.   Please advise at Mobile [phone] (mobile)

## 2023-10-03 NOTE — Telephone Encounter (Signed)
Please do PA. New insurance started today. Please also make sure patient has given his new insurance information to the pharmacy

## 2023-10-03 NOTE — Telephone Encounter (Signed)
This was just sent in a month ago for 90 days. Did he not pick it up because it's too soon for a refill

## 2023-10-03 NOTE — Telephone Encounter (Signed)
Called pt to go over results, pt states he has already received message. Pt states that he is needing PA for insulin glargine because the pharmacy told him insurance will not approve. Pt states it is too expensive and wanting to know if there is something similar that can be sent in. He has enough remaining for the weekend. Pt is requesting FU.

## 2023-10-03 NOTE — Telephone Encounter (Signed)
Request is too soon for refill.  Requested Prescriptions  Pending Prescriptions Disp Refills   Insulin Glargine Solostar 300 UNIT/ML SOPN [Pharmacy Med Name: INSULIN GLARGINE SOLOSTAR U300 PEN[R]] 1.5 mL 1    Sig: INJECT 20 UNITS INTO THE SKIN AT BEDTIME     Endocrinology:  Diabetes - Insulins Failed - 10/03/2023 11:28 AM      Failed - HBA1C is between 0 and 7.9 and within 180 days    HB A1C (BAYER DCA - WAIVED)  Date Value Ref Range Status  02/12/2023 6.6 (H) 4.8 - 5.6 % Final    Comment:             Prediabetes: 5.7 - 6.4          Diabetes: >6.4          Glycemic control for adults with diabetes: <7.0    Hgb A1c MFr Bld  Date Value Ref Range Status  09/12/2023 14.6 (H) 4.8 - 5.6 % Final    Comment:             Prediabetes: 5.7 - 6.4          Diabetes: >6.4          Glycemic control for adults with diabetes: <7.0          Passed - Valid encounter within last 6 months    Recent Outpatient Visits           3 weeks ago Routine general medical examination at a health care facility   [facility], [name], DO   7 months ago Uncontrolled type 2 diabetes mellitus with hyperglycemia (HCC)   [facility], [name], DO   9 months ago Diarrhea, unspecified type   [facility], [name], DO   10 months ago Uncontrolled type 2 diabetes mellitus with hyperglycemia [facility])   [facility], [name], DO   1 year ago Uncontrolled type 2 diabetes mellitus with hyperglycemia (HCC)   [place] Mecum, [name], PA-C       Future Appointments             In 6 days [name], [name], DO  [facility]

## 2023-10-06 ENCOUNTER — Telehealth: Payer: Self-pay | Admitting: Family Medicine

## 2023-10-06 NOTE — Telephone Encounter (Signed)
Copied from CRM [id number]. Topic: General - Other >> Oct 06, 2023  9:15 AM Everette C wrote: Reason for CRM: The patient would like to know if there are samples of Rx #: [id number]  empagliflozin (JARDIANCE) 25 MG TABS tablet [[id number]] available at the practice   Please contact further when available

## 2023-10-08 ENCOUNTER — Other Ambulatory Visit: Payer: Medicaid Other

## 2023-10-08 MED ORDER — BASAGLAR KWIKPEN 100 UNIT/ML ~~LOC~~ SOPN: 20.0000 [IU] | PEN_INJECTOR | Freq: Every day | SUBCUTANEOUS | 11 refills | Status: AC

## 2023-10-08 NOTE — Progress Notes (Signed)
   10/08/2023  Patient ID: [name], male   DOB: 07-09-1961, 62 y.o.   MRN: [id number]  S/O Telephone visit to follow-up on control of DM and medication access  DM -Current medications:  Toujeo 20 units daily, metformin 1000mg  BID -Patient is prescribed Jardiance 25mg  but has not had in several days due to cost of medication -Patient was provided with insulin sample from [facility] but is now running low on medication; he states insurance does not cover the prescribed Toujeo -FBG 110-130 per patient -Had contacted medication assistance team to assist with PAP applications for Toujeo and Jardiance; however, patient now has Nurse, learning disability.  This, unfortunately, makes him ineligible for PAP.  Medication Access/Adherence -Patient states insurance is not/minimally covering other medications at [facility]- endorses paying >$100/month on medications  A/P  DM -Greatly improved control on current regimen [employer], and they will pay for [employer], but copay will be >$150  -Order pending for Basaglar 20 units daily for Dr. [name] to sign if in agreement -Obtained copay cards for Basaglar and Jardiance on patient's behalf  Medication Access/Adherence -Contacted [facility], and they have the patient's insurance on file but state they just recently received this information -Contacted patient's insurance to see if plan is contracted with any particular pharmacies where patient could get medications at cheaper copays, but they state patient will need to call member services to obtain this information- I will inform him to do so -Patient's new insurance appears to have $6500 deductible, so this is likely affecting medication copays -Will discuss Medicaid eligibility with patient to see if he happens to qualify  Follow-up:  Once Basaglar order is signed, I will call pharmacy to check on copay and give Jardiance copay card then call patient to discuss all above information  Lenna Gilford, PharmD, DPLA

## 2023-10-09 ENCOUNTER — Telehealth (INDEPENDENT_AMBULATORY_CARE_PROVIDER_SITE_OTHER): Payer: Managed Care, Other (non HMO) | Admitting: Family Medicine

## 2023-10-09 ENCOUNTER — Encounter: Payer: Self-pay | Admitting: Family Medicine

## 2023-10-09 VITALS — BP 131/69 | HR 73

## 2023-10-09 DIAGNOSIS — Z7984 Long term (current) use of oral hypoglycemic drugs: Secondary | ICD-10-CM | POA: Diagnosis not present

## 2023-10-09 DIAGNOSIS — E1165 Type 2 diabetes mellitus with hyperglycemia: Secondary | ICD-10-CM

## 2023-10-09 MED ORDER — AMLODIPINE BESYLATE 5 MG PO TABS: 5.0000 mg | ORAL_TABLET | Freq: Every day | ORAL | 0 refills | Status: AC

## 2023-10-09 MED ORDER — DAPAGLIFLOZIN PROPANEDIOL 10 MG PO TABS: 10.0000 mg | ORAL_TABLET | Freq: Every day | ORAL | 3 refills | Status: AC

## 2023-10-09 MED ORDER — LOSARTAN POTASSIUM 50 MG PO TABS: 50.0000 mg | ORAL_TABLET | Freq: Every day | ORAL | 1 refills | Status: DC

## 2023-10-09 NOTE — Progress Notes (Signed)
Called and scheduled 12/09/2023 @ 8:20 am.

## 2023-10-09 NOTE — Progress Notes (Addendum)
   10/09/2023  Patient ID: Devin [place], male   DOB: 12/22/1960, 62 y.o.   MRN: [id number]  [name] 10mg  daily order signed by Dr. [name], and I included copay card processing information for pharmacy to bill to.  Contacted [facility] to check on insurance coverage and patient copay; [name] 10mg  will be $25.  Contacted patient to make him aware, and he endorses ability to afford copay for both [place] and Hospital doctor.  Telephone follow-up scheduled for 12/5.  Lenna Gilford, PharmD, DPLA

## 2023-10-09 NOTE — Progress Notes (Addendum)
   10/09/2023  Patient ID: Devin [place], male   DOB: 22-Mar-1961, 62 y.o.   MRN: [id number]  Contacted [facility] pharmacy to check on copays for Jardiance and [employer].  Insurance states [place] is not on formulary, and the Mariella Saa is going through for $25. Per CoverMyMeds eligibility check, patient's insurance requires a prior authorization for [place].  Upon submitting a prior authorization request for Jardiance, plan responded that they WILL cover Farxiga.  Obtained copay card and pending a prescription for Dr. [name] to sign for Farxiga 10mg  daily.  Will contact pharmacy to check on copay once order is sent and notify patient.  Lenna Gilford, PharmD, DPLA

## 2023-10-09 NOTE — Progress Notes (Signed)
8:40 not 8:20

## 2023-10-09 NOTE — Assessment & Plan Note (Signed)
Tolerating his insulin well. Will get back on jardiance if covered, working with pharmacy. Call with any concerns. Recheck 2 months. Call with any concerns.

## 2023-10-09 NOTE — Addendum Note (Signed)
Addended by: Sabino Niemann A on: 10/09/2023 09:45 AM   Modules accepted: Orders

## 2023-10-09 NOTE — Progress Notes (Signed)
BP 131/69   Pulse 73    Subjective:    Patient ID: Devin [place], male    DOB: 05/02/1961, 62 y.o.   MRN: [id number]  HPI: Devin [place] is a 62 y.o. male  Chief Complaint  Patient presents with   Diabetes    Patient says his insurance is no longer covering Jardiance. Patient says his last dose was Sunday. Patient says he has spoken with the pharmacy team and was informed that he would need a pre-approval for the medication. Patient says his reading has been good lately when he checks his readings.    DIABETES Hypoglycemic episodes:no Polydipsia/polyuria: no Visual disturbance: yes Chest pain: no Paresthesias: no Glucose Monitoring: yes  Accucheck frequency: Daily  Fasting glucose: 124, 108, 134, 122 Taking Insulin?: yes  Long acting insulin: 20 units Blood Pressure Monitoring: not checking Retinal Examination: Up to Date Foot Exam: Up to Date Diabetic Education: Completed Pneumovax: Not up to Date Influenza: Up to Date Aspirin: no  Relevant past medical, surgical, family and social history reviewed and updated as indicated. Interim medical history since our last visit reviewed. Allergies and medications reviewed and updated.  Review of Systems  Constitutional: Negative.   Respiratory: Negative.    Cardiovascular: Negative.   Musculoskeletal: Negative.   Psychiatric/Behavioral: Negative.      Per HPI unless specifically indicated above     Objective:    BP 131/69   Pulse 73   Wt Readings from Last 3 Encounters:  09/12/23 171 lb 9.6 oz (77.8 kg)  09/04/23 185 lb (83.9 kg)  04/15/23 187 lb 8 oz (85 kg)    Physical Exam Constitutional:      General: He is not in acute distress.    Appearance: Normal appearance. He is well-developed. He is not ill-appearing, toxic-appearing or diaphoretic.  HENT:     Head: Normocephalic and atraumatic.     Right Ear: Hearing and external ear normal.     Left Ear: Hearing and external ear normal.     Nose:  Nose normal.  Eyes:     General: Lids are normal. No scleral icterus.       Right eye: No discharge.        Left eye: No discharge.     Extraocular Movements: Extraocular movements intact.     Conjunctiva/sclera: Conjunctivae normal.     Pupils: Pupils are equal, round, and reactive to light.  Pulmonary:     Effort: Pulmonary effort is normal. No respiratory distress.  Musculoskeletal:        General: Normal range of motion.     Cervical back: Normal range of motion.  Skin:    Coloration: Skin is not jaundiced or pale.     Findings: No bruising, erythema, lesion or rash.  Neurological:     General: No focal deficit present.     Mental Status: He is alert and oriented to person, place, and time.  Psychiatric:        Mood and Affect: Mood normal.        Speech: Speech normal.        Behavior: Behavior normal.        Thought Content: Thought content normal.        Judgment: Judgment normal.     Results for orders placed or performed in visit on 09/12/23  Comprehensive metabolic panel  Result Value Ref Range   Glucose 320 (H) 70 - 99 mg/dL   BUN 22 8 - 27 mg/dL  Creatinine, Ser 1.14 0.76 - 1.27 mg/dL   eGFR 73 >16 XW/RUE/4.54   BUN/Creatinine Ratio 19 10 - 24   Sodium 133 (L) 134 - 144 mmol/L   Potassium 3.1 (L) 3.5 - 5.2 mmol/L   Chloride 90 (L) 96 - 106 mmol/L   CO2 18 (L) 20 - 29 mmol/L   Calcium 9.4 8.6 - 10.2 mg/dL   Total Protein 7.2 6.0 - 8.5 g/dL   Albumin 4.5 3.9 - 4.9 g/dL   Globulin, Total 2.7 1.5 - 4.5 g/dL   Bilirubin Total 0.9 0.0 - 1.2 mg/dL   Alkaline Phosphatase 105 44 - 121 IU/L   AST 24 0 - 40 IU/L   ALT 29 0 - 44 IU/L  CBC with Differential/Platelet  Result Value Ref Range   WBC 9.4 3.4 - 10.8 x10E3/uL   RBC 5.77 4.14 - 5.80 x10E6/uL   Hemoglobin 18.4 (H) 13.0 - 17.7 g/dL   Hematocrit 09.8 11.9 - 51.0 %   MCV 86 79 - 97 fL   MCH 31.9 26.6 - 33.0 pg   MCHC 36.9 (H) 31.5 - 35.7 g/dL   RDW 14.7 82.9 - 56.2 %   Platelets 304 150 - 450 x10E3/uL    Neutrophils 68 Not Estab. %   Lymphs 22 Not Estab. %   Monocytes 8 Not Estab. %   Eos 2 Not Estab. %   Basos 0 Not Estab. %   Neutrophils Absolute 6.3 1.4 - 7.0 x10E3/uL   Lymphocytes Absolute 2.1 0.7 - 3.1 x10E3/uL   Monocytes Absolute 0.7 0.1 - 0.9 x10E3/uL   EOS (ABSOLUTE) 0.2 0.0 - 0.4 x10E3/uL   Basophils Absolute 0.0 0.0 - 0.2 x10E3/uL   Immature Granulocytes 0 Not Estab. %   Immature Grans (Abs) 0.0 0.0 - 0.1 x10E3/uL   Hematology Comments: Note:   Lipid Panel w/o Chol/HDL Ratio  Result Value Ref Range   Cholesterol, Total 94 (L) 100 - 199 mg/dL   Triglycerides 130 (H) 0 - 149 mg/dL   HDL 41 >86 mg/dL   VLDL Cholesterol Cal 26 5 - 40 mg/dL   LDL Chol Calc (NIH) 27 0 - 99 mg/dL  PSA  Result Value Ref Range   Prostate Specific Ag, Serum 0.3 0.0 - 4.0 ng/mL  TSH  Result Value Ref Range   TSH 1.320 0.450 - 4.500 uIU/mL  Microalbumin, Urine Waived  Result Value Ref Range   Microalb, Ur Waived 10 0 - 19 mg/L   Creatinine, Urine Waived 50 10 - 300 mg/dL   Microalb/Creat Ratio <30 <30 mg/g  Hgb A1c w/o eAG  Result Value Ref Range   Hgb A1c MFr Bld 14.6 (H) 4.8 - 5.6 %  VITAMIN D 25 Hydroxy (Vit-D Deficiency, Fractures)  Result Value Ref Range   Vit D, 25-Hydroxy 34.0 30.0 - 100.0 ng/mL  HIV Antibody (routine testing w rflx)  Result Value Ref Range   HIV Screen 4th Generation wRfx Non Reactive Non Reactive      Assessment & Plan:   Problem List Items Addressed This Visit       Endocrine   Uncontrolled type 2 diabetes mellitus with hyperglycemia (HCC) - Primary    Tolerating his insulin well. Will get back on jardiance if covered, working with pharmacy. Call with any concerns. Recheck 2 months. Call with any concerns.       Relevant Medications   losartan (COZAAR) 50 MG tablet   Other Relevant Orders   Basic metabolic panel     Follow  up plan: Return in about 2 months (around 12/09/2023).    This visit was completed via video visit through MyChart due to the  restrictions of the COVID-19 pandemic. All issues as above were discussed and addressed. Physical exam was done as above through visual confirmation on video through MyChart. If it was felt that the patient should be evaluated in the office, they were directed there. The patient verbally consented to this visit. Location of the patient: home Location of the provider: work Those involved with this call:  Provider: Olevia Perches, DO CMA: Malen Gauze, CMA Front Desk/Registration: Servando Snare  Time spent on call:  15 minutes with patient face to face via video conference. More than 50% of this time was spent in counseling and coordination of care. 23 minutes total spent in review of patient's record and preparation of their chart.

## 2023-10-14 ENCOUNTER — Telehealth: Payer: Self-pay | Admitting: Family Medicine

## 2023-10-14 DIAGNOSIS — E1165 Type 2 diabetes mellitus with hyperglycemia: Secondary | ICD-10-CM

## 2023-10-14 DIAGNOSIS — E119 Type 2 diabetes mellitus without complications: Secondary | ICD-10-CM

## 2023-10-14 MED ORDER — ACCU-CHEK SOFTCLIX LANCETS MISC: 12 refills | Status: DC

## 2023-10-14 MED ORDER — ACCU-CHEK AVIVA PLUS VI STRP: ORAL_STRIP | 12 refills | Status: DC

## 2023-10-14 NOTE — Telephone Encounter (Signed)
Pt needs the strip and needles for the accuchek glucose meter.  He forgot to tell Dr. [name] when he was in.    He would like them sent to [facility] in [place]/  CB@  [phone]

## 2023-11-06 ENCOUNTER — Other Ambulatory Visit: Payer: Self-pay

## 2023-11-06 NOTE — Progress Notes (Signed)
   11/06/2023  Patient ID: Devin [place], male   DOB: 09-08-1961, 62 y.o.   MRN: [id number]  Outreach for scheduled telephone visit was unsuccessful, but I was able to leave HIPAA compliant voicemail with my direct phone number.  I will try to contact patient again next week if I do not hear back.  Lenna Gilford, PharmD, DPLA

## 2023-11-11 ENCOUNTER — Other Ambulatory Visit: Payer: Self-pay

## 2023-11-11 NOTE — Progress Notes (Unsigned)
   11/11/2023  Patient ID: Devin [place], male   DOB: February 03, 1961, 62 y.o.   MRN: [id number]  Outreach attempt to check in on management of diabetes was unsuccessful, but I was able to leave a HIPAA compliant voicemail with my direct phone number.  I will try to reach the patient again next week if I do not hear back.  Lenna Gilford, PharmD, DPLA

## 2023-11-17 ENCOUNTER — Other Ambulatory Visit: Payer: Self-pay

## 2023-11-17 MED ORDER — PEN NEEDLES 32G X 4 MM MISC: 12 refills | Status: AC

## 2023-11-17 MED ORDER — LOSARTAN POTASSIUM 50 MG PO TABS: 50.0000 mg | ORAL_TABLET | Freq: Every day | ORAL | 0 refills | Status: AC

## 2023-11-17 NOTE — Progress Notes (Signed)
   11/17/2023  Patient ID: Devin [place], male   DOB: [birth year], 62 y.o.   MRN: [id number]  S/O Telephone visit to follow-up on management of diabetes  Diabetes Management Plan -Current medications:  Basaglar 20 units nightly, Farxiga 10mg  daily -Patient checks FBG daily and states readings have been 116-140 -Devin [place] expresses concern with recent insulin copay of $70, but it appears this was filled for a 75 day supply.  A 1 month supply would still be $35 with his copay card -He also endorses his insurance plan is changing at the beginning of 2025 and will not cover insulin- copay card will keep this at $35/month, though -Patient is requesting and order for pen needles be sent to [facility], so these can be billed to insurance versus him paying OTC cost -He also states he recently got Accu-Chek testing supplies from the pharmacy and his test strips are $35/month, which is expensive for him  A/P  Diabetes Management Plan -Continue current regimen at this time -Patient sees PCP again 1/7 and will be due for A1c; if >7%, could consider increasing insulin dosing by 10-20%, initiating Januvia, or re-trial of a different GLP1 (did not tolerate ozempic in the past)- cost could be a limiting factor with Januvia and/or GLP1 -Pending prescription for pen needles for Dr. [name] to sign if in agreement -Contacting insurance to see if different testing supplies would be cheaper.  This may also change with new insurance plan next year.  Patient could also look into ReliOn or True Metrix supplies OTC/online, which tend to be around $15/month for strips and lancets.  This would require a new glucometer that is compatible.   Follow-up:  Will notify patient about testing supplies and schedule a follow-up after the 1st of the year  Lenna Gilford, PharmD, DPLA

## 2023-11-18 ENCOUNTER — Other Ambulatory Visit: Payer: Self-pay

## 2023-11-18 MED ORDER — ONETOUCH DELICA LANCETS 30G MISC: 11 refills | Status: AC

## 2023-11-18 MED ORDER — ONETOUCH VERIO VI STRP: ORAL_STRIP | 12 refills | Status: AC

## 2023-11-18 MED ORDER — ONETOUCH VERIO FLEX SYSTEM DEVI: 1.0000 | Freq: Every day | 0 refills | Status: AC

## 2023-11-18 NOTE — Progress Notes (Signed)
   11/18/2023  Patient ID: Devin [place], male   DOB: 07/11/1961, 62 y.o.   MRN: [id number]  Contacted insurance to see which brand of diabetic testing supplies are covered.  [employer] states One Touch Ultra or One Touch Verio are preferred on the patient's plan.  When insurance ran a test claim for 100 strips for a 90 day supply, the patient's copay came back $0.  Informed patient we will send in prescriptions for One Touch meter, strips, and lancets.    I also informed the patient that his insulin should continue to be $35/month even if insurance does not cover and let him know that a prescription for pen needles was sent in to his pharmacy yesterday.  Follow-up visit scheduled for early January to follow-up on new insurance and coverage of current prescriptions.  Lenna Gilford, PharmD, DPLA

## 2023-12-05 ENCOUNTER — Other Ambulatory Visit: Payer: Self-pay | Admitting: Family Medicine

## 2023-12-05 NOTE — Telephone Encounter (Signed)
 Medication Refill -  Most Recent Primary Care Visit:  Provider: VICCI DUWAINE SQUIBB  Department: [facility]  Visit Type: MYCHART VIDEO VISIT  Date: 10/09/2023  Medication: dapagliflozin  propanediol (FARXIGA ) 10 MG TABS tablet [[id number]]   Pt is requesting a 6 month supply.  Has the patient contacted their pharmacy? Yes (Agent: If no, request that the patient contact the pharmacy for the refill. If patient does not wish to contact the pharmacy document the reason why and proceed with request.) (Agent: If yes, when and what did the pharmacy advise?)  Is this the correct pharmacy for this prescription? Yes If no, delete pharmacy and type the correct one.  This is the patient's preferred pharmacy:  [facility] [address] [facility] - [place], [place] - [address] [place] [address] Phone: [phone] Fax: [phone]   Has the prescription been filled recently? Yes  Is the patient out of the medication? Yes Patient reports that he has been out of medication for 3 days.  Has the patient been seen for an appointment in the last year OR does the patient have an upcoming appointment? Yes  Can we respond through MyChart? No  Agent: Please be advised that Rx refills may take up to 3 business days. We ask that you follow-up with your pharmacy.

## 2023-12-06 ENCOUNTER — Other Ambulatory Visit: Payer: Self-pay | Admitting: Family Medicine

## 2023-12-06 DIAGNOSIS — E785 Hyperlipidemia, unspecified: Secondary | ICD-10-CM

## 2023-12-09 ENCOUNTER — Other Ambulatory Visit: Payer: Self-pay

## 2023-12-09 ENCOUNTER — Ambulatory Visit: Payer: Commercial Managed Care - HMO | Admitting: Family Medicine

## 2023-12-09 NOTE — Progress Notes (Signed)
   12/09/2023  Patient ID: Devin Arias, male   DOB: Aug 20, 1961, 63 y.o.   MRN: [id number]  Outreach attempt to follow-up on management of DM unsuccessful, but I was able to leave HIPAA compliant voicemail with my direct phone number.  If I do not hear back from patient, I will try to call again next Monday.  Devin Arias, PharmD, DPLA

## 2023-12-09 NOTE — Telephone Encounter (Signed)
 Requested Prescriptions  Refused Prescriptions Disp Refills   FARXIGA  10 MG TABS tablet [Pharmacy Med Name: FARXIGA  10 MG TAB[**$]] 30 tablet 3    Sig: TAKE ONE TABLET BY MOUTH EVERY MORNING BEFORE BREAKFAST     There is no refill protocol information for this order

## 2023-12-09 NOTE — Telephone Encounter (Signed)
 Requested Prescriptions  Refused Prescriptions Disp Refills   atorvastatin  (LIPITOR) 40 MG tablet [Pharmacy Med Name: ATORVASTATIN  40 MG TAB[*]] 90 tablet 1    Sig: TAKE ONE TABLET BY MOUTH ONE TIME DAILY     Cardiovascular:  Antilipid - Statins Failed - 12/09/2023  2:25 PM      Failed - Lipid Panel in normal range within the last 12 months    Cholesterol, Total  Date Value Ref Range Status  09/12/2023 94 (L) 100 - 199 mg/dL Final   LDL Chol Calc (NIH)  Date Value Ref Range Status  09/12/2023 27 0 - 99 mg/dL Final   HDL  Date Value Ref Range Status  09/12/2023 41 >39 mg/dL Final   Triglycerides  Date Value Ref Range Status  09/12/2023 158 (H) 0 - 149 mg/dL Final         Passed - Patient is not pregnant      Passed - Valid encounter within last 12 months    Recent Outpatient Visits           2 months ago Uncontrolled type 2 diabetes mellitus with hyperglycemia (HCC)   [place] [facility] [place], [name], DO   2 months ago Routine general medical examination at a health care facility   [facility] [place] P, DO   10 months ago Uncontrolled type 2 diabetes mellitus with hyperglycemia [facility])   [facility] [place], [name], DO   12 months ago Diarrhea, unspecified type   [facility] [place], [name], DO   1 year ago Uncontrolled type 2 diabetes mellitus with hyperglycemia [facility])    [facility] [place], Duwaine SQUIBB, DO       Future Appointments             In 2 weeks Vicci, Duwaine SQUIBB, DO  [facility]

## 2023-12-15 ENCOUNTER — Other Ambulatory Visit: Payer: Self-pay

## 2023-12-15 NOTE — Progress Notes (Unsigned)
   12/15/2023  Patient ID: Devin Arias, male   DOB: 03/04/1961, 63 y.o.   MRN: [id number]  Outreach attempt to follow up on management of diabetes was unsuccessful, but I was able to leave a HIPAA compliant voicemail with my direct number.  I will try to call patient again in 1-2 weeks if I do not hear back.  Channing DELENA Mealing, PharmD, DPLA

## 2023-12-16 LAB — HEMOGLOBIN A1C: Hemoglobin A1C: 7.6

## 2023-12-19 ENCOUNTER — Other Ambulatory Visit: Payer: Self-pay | Admitting: Family Medicine

## 2023-12-19 DIAGNOSIS — E785 Hyperlipidemia, unspecified: Secondary | ICD-10-CM

## 2023-12-19 NOTE — Telephone Encounter (Signed)
Duplicate requests, too soon for refill.  Requested Prescriptions  Pending Prescriptions Disp Refills   FARXIGA 10 MG TABS tablet [Pharmacy Med Name: FARXIGA 10 MG TAB[**$]] 30 tablet 3    Sig: TAKE ONE TABLET BY MOUTH EVERY MORNING BEFORE BREAKFAST     There is no refill protocol information for this order     atorvastatin (LIPITOR) 40 MG tablet [Pharmacy Med Name: ATORVASTATIN 40 MG TAB[*]] 90 tablet 1    Sig: TAKE ONE TABLET BY MOUTH ONE TIME DAILY     There is no refill protocol information for this order

## 2023-12-22 ENCOUNTER — Encounter: Payer: Self-pay | Admitting: Family Medicine

## 2023-12-22 ENCOUNTER — Other Ambulatory Visit: Payer: Self-pay

## 2023-12-22 NOTE — Progress Notes (Signed)
   12/22/2023  Patient ID: Devin Arias, male   DOB: 12-Jun-1961, 63 y.o.   MRN: [id number]  Prior to attempting to reach patient again to follow-up on adherence and affordability of medications, I noticed patient was recently seen to establish care with family medicine through [facility].  Patient also has a follow-up scheduled with Dr. [name] for 1/24.  Contacting her to make her aware and let her know if patient does present for this appointment to contact me if further assistance and follow-up is needed on my part.  Lenna Gilford, PharmD, DPLA

## 2023-12-26 ENCOUNTER — Ambulatory Visit: Payer: Medicaid Other | Admitting: Family Medicine

## 2023-12-27 ENCOUNTER — Other Ambulatory Visit: Payer: Self-pay | Admitting: Family Medicine

## 2023-12-29 ENCOUNTER — Telehealth: Payer: Self-pay

## 2023-12-29 ENCOUNTER — Other Ambulatory Visit (HOSPITAL_COMMUNITY): Payer: Self-pay

## 2023-12-29 NOTE — Telephone Encounter (Signed)
Pt does not qualify  PAP DUE TO PT HAS COMMERCIAL INSURANCE TEST CLAIM PROVES CO-PAY OF 35.00 FOR BASAGLAR ([facility])      THE NOTE WAS FOR TOUJEO BUT PT STATES HE DO NOT USE BASAGLAR

## 2023-12-29 NOTE — Telephone Encounter (Signed)
Pt does not qualify  PAP DUE TO PT HAS COMMERCIAL INSURANCE TEST CLAIM PROVES CO-PAY OF 15.00 JARDIANCE(BICARES)  PLEASE BE ADVISED   Melanee Spry CPhT Rx Patient Advocate [phone]

## 2023-12-29 NOTE — Telephone Encounter (Signed)
-----   Message from Lenna Gilford sent at 09/23/2023 12:53 PM EDT ----- Peri Jefferson afternoon,  Mr. Ates does not qualify for Medicare D yet, and he has been denied full Medicaid benefit (family planning only).  He does not currently have any form of prescription insurance; but based on [facility], he would qualify for Jardiance 25mg  daily PAP through [employer] and Toujeo 20 units nightly through [employer].  Could you all assist with initiating these applications, please?  Thank you!  Elnita Maxwell

## 2023-12-30 NOTE — Telephone Encounter (Signed)
Requested medications are due for refill today. yes  Requested medications are on the active medications list.  yes  Last refill. 10/09/2023 #90 0 rf  Future visit scheduled.   no  Notes to clinic.  Sherron Flemings listed as PCP.  It appears that pt recently established with this provider.    Requested Prescriptions  Pending Prescriptions Disp Refills   amLODipine (NORVASC) 5 MG tablet [Pharmacy Med Name: AMLODIPINE 5 MG TAB[*]] 90 tablet 0    Sig: TAKE ONE TABLET BY MOUTH ONE TIME DAILY     Cardiovascular: Calcium Channel Blockers 2 Passed - 12/30/2023  8:13 AM      Passed - Last BP in normal range    BP Readings from Last 1 Encounters:  10/09/23 131/69         Passed - Last Heart Rate in normal range    Pulse Readings from Last 1 Encounters:  10/09/23 73         Passed - Valid encounter within last 6 months    Recent Outpatient Visits           2 months ago Uncontrolled type 2 diabetes mellitus with hyperglycemia (HCC)   [facility] [place], [name], DO   3 months ago Routine general medical examination at a health care facility   [facility] [place] [facility], DO   10 months ago Uncontrolled type 2 diabetes mellitus with hyperglycemia [facility])   [place] [facility] [place], [name], DO   1 year ago Diarrhea, unspecified type   [place] [facility] [place], [name], DO   1 year ago Uncontrolled type 2 diabetes mellitus with hyperglycemia [facility])   [place] [facility] [place], [place], DO

## 2024-03-25 ENCOUNTER — Other Ambulatory Visit: Payer: Self-pay | Admitting: Family Medicine

## 2024-03-26 NOTE — Telephone Encounter (Signed)
 Patient no longer under prescribers care, please send to Dalton Duff, FNP office. Requested Prescriptions  Pending Prescriptions Disp Refills   amLODipine  (NORVASC ) 5 MG tablet [Pharmacy Med Name: AMLODIPINE  5 MG TAB[*]] 90 tablet 0    Sig: TAKE ONE TABLET BY MOUTH ONE TIME DAILY     Cardiovascular: Calcium  Channel Blockers 2 Failed - 03/26/2024  1:18 PM      Failed - Valid encounter within last 6 months    Recent Outpatient Visits   None            Passed - Last BP in normal range    BP Readings from Last 1 Encounters:  10/09/23 131/69         Passed - Last Heart Rate in normal range    Pulse Readings from Last 1 Encounters:  10/09/23 73

## 2024-04-20 ENCOUNTER — Telehealth: Payer: Self-pay

## 2024-04-20 ENCOUNTER — Other Ambulatory Visit (HOSPITAL_COMMUNITY): Payer: Self-pay

## 2024-04-20 NOTE — Telephone Encounter (Signed)
 Pharmacy Patient Advocate Encounter  Received notification from [facility] that Prior Authorization for Ozempic  (2 MG/DOSE) 8MG /3ML pen-injectors  has been APPROVED from 04/20/24 to 04/20/25. Ran test claim, Copay is $15.00. This test claim was processed through [facility]- copay amounts may vary at other pharmacies due to pharmacy/plan contracts, or as the patient moves through the different stages of their insurance plan.

## 2024-04-20 NOTE — Telephone Encounter (Signed)
 Pharmacy Patient Advocate Encounter   Received notification from Onbase that prior authorization for Ozempic  (2 MG/DOSE) 8MG /3ML pen-injectors is required/requested.   Insurance verification completed.   The patient is insured through [facility] .   Per test claim: PA required; PA submitted to above mentioned insurance via CoverMyMeds Key/confirmation #/[id number] Status is pending

## 2024-05-19 LAB — GLUCOSE, POCT (MANUAL RESULT ENTRY): POC Glucose: 319 mg/dL — AB (ref 70–99)

## 2024-05-19 NOTE — Congregational Nurse Program (Signed)
  Dept: [phone]   Congregational Nurse Program Note  Date of Encounter: 05/19/2024  Past Medical History: Past Medical History:  Diagnosis Date   Diabetes mellitus without complication (HCC)    Hypertension     Encounter Details:  Community Questionnaire - 05/19/24 1523       Questionnaire   Ask client: Do you give verbal consent for me to treat you today? Yes    Student Assistance N/A    Location Patient Served  [facility]    Encounter Setting CN site    Population Status Unknown    Insurance Medicaid    Insurance/Financial Assistance Referral N/A    Medication Have Medication Insecurities    Medical Provider Yes    Screening Referrals Made N/A    Medical Referrals Made Dental    Medical Appointment Completed N/A    CNP Interventions Navigate Healthcare System    Screenings CN Performed Blood Pressure;Blood Glucose    ED Visit Averted N/A    Life-Saving Intervention Made N/A            Dept: [phone]   Congregational Nurse Program Note  Date of Encounter: 05/19/2024 BP 138/75 (BP Location: Left Arm, Patient Position: Sitting, Cuff Size: Normal)   Pulse 80  Cbg 319 Pt needs assistance with getting strips for his glucometer and for dental. Will find resources for pt and call him with information re dental and diabetes supplies.  Pheobe Brass, RN, CCNP    Past Medical History: Past Medical History:  Diagnosis Date   Diabetes mellitus without complication (HCC)    Hypertension     Encounter Details:

## 2024-05-20 ENCOUNTER — Other Ambulatory Visit: Payer: Self-pay
